# Patient Record
Sex: Female | Born: 1985 | Race: Asian | Hispanic: No | Marital: Married | Smoking: Never smoker
Health system: Southern US, Community
[De-identification: ages and names within clinical notes are randomized; demographics above are authoritative.]

## PROBLEM LIST (undated history)

## (undated) DIAGNOSIS — Z789 Other specified health status: Secondary | ICD-10-CM

## (undated) DIAGNOSIS — R51 Headache: Secondary | ICD-10-CM

## (undated) DIAGNOSIS — R519 Headache, unspecified: Secondary | ICD-10-CM

## (undated) HISTORY — DX: Headache, unspecified: R51.9

## (undated) HISTORY — DX: Headache: R51

## (undated) HISTORY — PX: NO PAST SURGERIES: SHX2092

---

## 2011-05-18 LAB — HM PAP SMEAR: HM Pap smear: NORMAL

## 2011-10-24 ENCOUNTER — Ambulatory Visit (INDEPENDENT_AMBULATORY_CARE_PROVIDER_SITE_OTHER): Payer: Managed Care, Other (non HMO) | Admitting: Internal Medicine

## 2011-10-24 ENCOUNTER — Encounter: Payer: Self-pay | Admitting: Internal Medicine

## 2011-10-24 VITALS — BP 90/60 | HR 76 | Temp 98.2°F | Resp 16 | Ht 64.0 in | Wt 115.0 lb

## 2011-10-24 DIAGNOSIS — Z0000 Encounter for general adult medical examination without abnormal findings: Secondary | ICD-10-CM

## 2011-10-24 LAB — COMPREHENSIVE METABOLIC PANEL
ALT: 13 U/L (ref 0–35)
AST: 19 U/L (ref 0–37)
Albumin: 4.3 g/dL (ref 3.5–5.2)
Alkaline Phosphatase: 49 U/L (ref 39–117)
BUN: 11 mg/dL (ref 6–23)
CO2: 27 mEq/L (ref 19–32)
Calcium: 9.2 mg/dL (ref 8.4–10.5)
Chloride: 104 mEq/L (ref 96–112)
Creatinine, Ser: 0.6 mg/dL (ref 0.4–1.2)
GFR: 124.09 mL/min (ref >60.00)
Glucose, Bld: 78 mg/dL (ref 70–99)
Potassium: 4 mEq/L (ref 3.5–5.1)
Sodium: 138 mEq/L (ref 135–145)
Total Bilirubin: 0.4 mg/dL (ref 0.3–1.2)
Total Protein: 7.4 g/dL (ref 6.0–8.3)

## 2011-10-24 LAB — LIPID PANEL
Cholesterol: 151 mg/dL (ref 0–200)
LDL Cholesterol: 82 mg/dL (ref 0–99)

## 2011-10-24 MED ORDER — TRETINOIN 0.05 % EX CREA: TOPICAL_CREAM | Freq: Every day | CUTANEOUS | Status: DC

## 2011-10-24 NOTE — Progress Notes (Signed)
Quick Note:  Spoke with pt- informed of lab results - mailed to house ______

## 2011-10-24 NOTE — Progress Notes (Signed)
  Subjective:    Patient ID: Jamie Jordan, female    DOB: 1986/01/30, 26 y.o.   MRN: [id number]  HPI  26 year old patient who is seen today to establish with our practice. She was seen in November and had a normal gynecologic evaluation with normal Pap. She enjoys excellent health. Concerns are a long history of left eye discomfort. This occurs approximately once per month and usually last 2-3 hours the pain resolves after  a nap.  Also seems to respond to Tylenol  She has a history of mild acne controlled with topical medications.    Review of Systems  Constitutional: Negative.   HENT: Negative for hearing loss, congestion, sore throat, rhinorrhea, dental problem, sinus pressure and tinnitus.   Eyes: Negative for pain, discharge and visual disturbance.  Respiratory: Negative for cough and shortness of breath.   Cardiovascular: Negative for chest pain, palpitations and leg swelling.  Gastrointestinal: Negative for nausea, vomiting, abdominal pain, diarrhea, constipation, blood in stool and abdominal distention.  Genitourinary: Negative for dysuria, urgency, frequency, hematuria, flank pain, vaginal bleeding, vaginal discharge, difficulty urinating, vaginal pain and pelvic pain.  Musculoskeletal: Negative for joint swelling, arthralgias and gait problem.  Skin: Positive for rash (mild acne eruptions).  Neurological: Positive for headaches. Negative for dizziness, syncope, speech difficulty, weakness and numbness.  Hematological: Negative for adenopathy.  Psychiatric/Behavioral: Negative for behavioral problems, dysphoric mood and agitation. The patient is not nervous/anxious.        Objective:   Physical Exam  Constitutional: She is oriented to person, place, and time. She appears well-developed and well-nourished.  HENT:  Head: Normocephalic.  Right Ear: External ear normal.  Left Ear: External ear normal.  Mouth/Throat: Oropharynx is clear and moist.  Eyes: Conjunctivae and EOM  are normal. Pupils are equal, round, and reactive to light.  Neck: Normal range of motion. Neck supple. No thyromegaly present.  Cardiovascular: Normal rate, regular rhythm, normal heart sounds and intact distal pulses.   Pulmonary/Chest: Effort normal and breath sounds normal.  Abdominal: Soft. Bowel sounds are normal. She exhibits no mass. There is no tenderness.  Musculoskeletal: Normal range of motion.  Lymphadenopathy:    She has no cervical adenopathy.  Neurological: She is alert and oriented to person, place, and time.  Skin: Skin is warm and dry. No rash noted.       Very mild resolving acneiform lesions involving the malar aspect of the face  Psychiatric: She has a normal mood and affect. Her behavior is normal.          Assessment & Plan:   Preventive health examination Mild acne Left periorbital headaches. Possible migraine syndrome. We'll give a trial of Excedrin Migraine over-the-counter  Return at her convenience for a PPD skin test

## 2011-10-24 NOTE — Patient Instructions (Addendum)
Excedrin Migraine-  1 every 4-6 hours for left  eye discomfort  Return at your convenience for a PPD skin test  Call or return to clinic prn if these symptoms worsen or fail to improve as anticipated.Acne Acne is a skin problem that causes pimples. Acne occurs when the pores in your skin get blocked. Your pores may become red, sore, and swollen (inflamed), or infected with a common skin bacterium (Propionibacterium acnes). Acne is a common skin problem. Up to 80% of people get acne at some time. Acne is especially common from the ages of 40 to 68. Acne usually goes away over time with proper treatment. CAUSES   Your pores each contain an oil gland. The oil glands make an oily substance called sebum. Acne happens when these glands get plugged with sebum, dead skin cells, and dirt. The P. acnes bacteria that are normally found in the oil glands then multiply, causing inflammation. Acne is commonly triggered by changes in your hormones. These hormonal changes can cause the oil glands to get bigger and to make more sebum. Factors that can make acne worse include:  Hormone changes during adolescence.   Hormone changes during women's menstrual cycles.   Hormone changes during pregnancy.   Oil-based cosmetics and hair products.   Harshly scrubbing the skin.   Strong soaps.   Stress.   Hormone problems due to certain diseases.   Long or oily hair rubbing against the skin.   Certain medicines.   Pressure from headbands, backpacks, or shoulder pads.   Exposure to certain oils and chemicals.  SYMPTOMS   Acne often occurs on the face, neck, chest, and upper back. Symptoms include:  Small, red bumps (pimples or papules).   Whiteheads (closed comedones).   Blackheads (open comedones).   Small, pus-filled pimples (pustules).   Big, red pimples or pustules that feel tender.  More severe acne can cause:  An infected area that contains a collection of pus (abscess).   Hard, painful,  fluid-filled sacs (cysts).   Scars.  DIAGNOSIS   Your caregiver can usually tell what the problem is by doing a physical exam. TREATMENT   There are many good treatments for acne. Some are available over-the-counter and some are available with a prescription. The treatment that is best for you depends on the type of acne you have and how severe it is. It may take 2 months of treatment before your acne gets better. Common treatments include:  Creams and lotions that prevent oil glands from clogging.   Creams and lotions that treat or prevent infections and inflammation.   Antibiotics applied to the skin or taken as a pill.   Pills that decrease sebum production.   Birth control pills.   Light or laser treatments.   Minor surgery.   Injections of medicine into the affected areas.   Chemicals that cause peeling of the skin.  HOME CARE INSTRUCTIONS   Good skin care is the most important part of treatment.  Wash your skin gently at least twice a day and after exercise. Always wash your skin before bed.   Use mild soap.   After each wash, apply a water-based skin moisturizer.   Keep your hair clean and off of your face. Shampoo your hair daily.   Only take medicines as directed by your caregiver.   Use a sunscreen or sunblock with SPF 30 or greater. This is especially important when you are using acne medicines.   Choose cosmetics that are noncomedogenic.  This means they do not plug the oil glands.   Avoid leaning your chin or forehead on your hands.   Avoid wearing tight headbands or hats.   Avoid picking or squeezing your pimples. This can make your acne worse and cause scarring.  SEEK MEDICAL CARE IF:    Your acne is not better after 8 weeks.   Your acne gets worse.   You have a large area of skin that is red or tender.  Document Released: 06/30/2000 Document Revised: 06/22/2011 Document Reviewed: 04/21/2011 [facility] Patient Information 2012 [place],  [place].Tretinoin skin cream (Acne) What is this medicine? TRETINOIN (TRET i noe in) is a naturally occurring form of vitamin A. It is used on the skin to treat mild to moderate acne. This medicine may be used for other purposes; ask your health care provider or pharmacist if you have questions. What should I tell my health care provider before I take this medicine? They need to know if you have any of these conditions: -eczema -excessive sensitivity to the sun -sunburn -an unusual or allergic reaction to tretinoin, vitamin A, other medicines, foods, dyes, or preservatives -pregnant or trying to get pregnant -breast-feeding How should I use this medicine? This medicine is for external use only. Do not take by mouth. Follow the directions on the prescription label. Gently wash your face with a mild, non-medicated soap before use. Pat the skin dry. Wait 20 to 30 minutes for your skin to dry before use in order to minimize the possibility of skin irritation. Apply enough medicine to cover the affected area and rub in gently. Avoid applying this medicine to your eyes, ears, nostrils, angles of the nose, and mouth. Do not use more often than your doctor or health care professional has recommended. Using too much of this medicine may irritate or increase the irritation of your skin, and will not give faster or better results. Talk to your pediatrician regarding the use of this medicine in children. While this drug may be prescribed for children as young as 56 years of age for selected conditions, precautions do apply. Overdosage: If you think you have taken too much of this medicine contact a poison control center or emergency room at once. NOTE: This medicine is only for you. Do not share this medicine with others. What if I miss a dose? If you miss a dose, skip that dose and continue with your regular schedule. Do not use extra doses, or use for a longer period of time than directed by your doctor or  health care professional. What may interact with this medicine? -medicines or other preparations that may dry your skin such as benzoyl peroxide or salicylic acid -medicines that increase your sensitivity to sunlight such as tetracycline or sulfa drugs This list may not describe all possible interactions. Give your health care provider a list of all the medicines, herbs, non-prescription drugs, or dietary supplements you use. Also tell them if you smoke, drink alcohol, or use illegal drugs. Some items may interact with your medicine. What should I watch for while using this medicine? Your acne may get worse initially and should then start to improve. It may take 2 to 12 weeks before you see the full effect. Do not wash your face more than 2 or 3 times a day, unless directed by your doctor or health care professional. Do not use the following products on the same areas that you are treating with this medicine, unless otherwise directed by your doctor or  health care professional: other topical agents with a strong skin drying effect such as products with a high alcohol content, astringents, spices, the peel of lime or other citrus, medicated soaps or shampoos, permanent wave solutions, electrolysis, hair removers or waxes, or any other preparations or processes that might dry or irritate your skin. This medicine can make you more sensitive to the sun. Keep out of the sun. If you cannot avoid being in the sun, wear protective clothing and use sunscreen. Do not use sun lamps or tanning beds/booths. Avoid cold weather and wind as much as possible, and use clothing to protect you from the weather. Skin treated with this medicine may dry out or get wind burned more easily. What side effects may I notice from receiving this medicine? Side effects that you should report to your doctor or health care professional as soon as possible: -darkening or lightening of the treated areas -severe burning, itching, crusting,  or swelling of the treated areas Side effects that usually do not require medical attention (report to your doctor or health care professional if they continue or are bothersome): -increased sensitivity to the sun -itching -mild stinging -red, inflamed, and irritated skin, the skin may peel after a few days This list may not describe all possible side effects. Call your doctor for medical advice about side effects. You may report side effects to FDA at 1-800-FDA-1088. Where should I keep my medicine? Keep out of the reach of children. Store below 27 degrees C (80 degrees F). Do not freeze. Protect from light. Throw away any unused medicine after the expiration date. NOTE: This sheet is a summary. It may not cover all possible information. If you have questions about this medicine, talk to your doctor, pharmacist, or health care provider.  2012, [employer]vier/[employer] Standard. (03/18/2008 5:38:22 PM)

## 2012-05-17 ENCOUNTER — Telehealth: Payer: Self-pay | Admitting: Internal Medicine

## 2012-05-17 NOTE — Telephone Encounter (Signed)
Ok with me. Should get new patient visit at some point to establish. Thanks.

## 2012-05-17 NOTE — Telephone Encounter (Signed)
ok 

## 2012-05-17 NOTE — Telephone Encounter (Signed)
Pt requesting to switch to female physician, would like to see Dr. Selena Batten.

## 2012-05-23 ENCOUNTER — Encounter: Payer: Managed Care, Other (non HMO) | Admitting: Family Medicine

## 2012-05-23 NOTE — Progress Notes (Signed)
No show  This encounter was created in error - please disregard.

## 2012-05-29 ENCOUNTER — Ambulatory Visit: Payer: Managed Care, Other (non HMO) | Admitting: Family Medicine

## 2012-06-04 ENCOUNTER — Encounter: Payer: Self-pay | Admitting: Family Medicine

## 2012-06-04 ENCOUNTER — Other Ambulatory Visit (HOSPITAL_COMMUNITY)
Admission: RE | Admit: 2012-06-04 | Discharge: 2012-06-04 | Disposition: A | Discharge status: Home / Self Care | Payer: Managed Care, Other (non HMO) | Source: Ambulatory Visit | Attending: Family Medicine | Admitting: Family Medicine

## 2012-06-04 ENCOUNTER — Ambulatory Visit (INDEPENDENT_AMBULATORY_CARE_PROVIDER_SITE_OTHER): Payer: Managed Care, Other (non HMO) | Admitting: Family Medicine

## 2012-06-04 VITALS — BP 92/62 | HR 87 | Temp 98.8°F | Ht 63.75 in | Wt 118.0 lb

## 2012-06-04 DIAGNOSIS — Z209 Contact with and (suspected) exposure to unspecified communicable disease: Secondary | ICD-10-CM

## 2012-06-04 DIAGNOSIS — L292 Pruritus vulvae: Secondary | ICD-10-CM | POA: Insufficient documentation

## 2012-06-04 DIAGNOSIS — N760 Acute vaginitis: Secondary | ICD-10-CM | POA: Insufficient documentation

## 2012-06-04 DIAGNOSIS — L293 Anogenital pruritus, unspecified: Secondary | ICD-10-CM

## 2012-06-04 DIAGNOSIS — Z8669 Personal history of other diseases of the nervous system and sense organs: Secondary | ICD-10-CM | POA: Insufficient documentation

## 2012-06-04 MED ORDER — FLUCONAZOLE 150 MG PO TABS: 150.0000 mg | ORAL_TABLET | Freq: Once | ORAL | Status: DC

## 2012-06-04 MED ORDER — SUMATRIPTAN SUCCINATE 50 MG PO TABS: ORAL_TABLET | ORAL | Status: DC

## 2012-06-04 NOTE — Addendum Note (Signed)
Addended by: Azucena Freed on: 06/04/2012 03:09 PM   Modules accepted: Orders

## 2012-06-04 NOTE — Patient Instructions (Addendum)
-  We have ordered labs or studies at this visit. It can take up to 1-2 weeks for results and processing. We will contact you with instructions IF your results are abnormal. Normal results will be released to your [facility]. If you have not heard from [place] or can not find your results in [facility] in 2 weeks please contact our office.  -PLEASE SIGN UP FOR MYCHART TODAY   We recommend the following healthy lifestyle measures: - eat a healthy diet consisting of lots of vegetables, fruits, beans, nuts, seeds, healthy meats such as white chicken and fish and whole grains.  - avoid fried foods, fast food, processed foods, sodas, red meet and other fattening foods.  - get a least 150 minutes of aerobic exercise per week.   Only use water of dove soap to wash. Can use vasoline for dry skin.  Follow up in: 2-3 months

## 2012-06-04 NOTE — Progress Notes (Addendum)
Chief Complaint  Patient presents with  . Establish Care    HPI: Jamie Jordan is here to establish care. Wanted a female doctor and transferring from Dr. Kirtland Bouchard. Last physical in April. Last pap in November - normal. Never had abnormal pap. Only sexually active with her husband. No new partners. Masters in [employer] and hearing. Moved from [place]. Exercise and Diet is ok.  Has the following concerns today:  Vulvovaginal pruritis: -on and off -reports tested by gyn and just dryness -no abnormal vaginal discharge  Headaches: -for a long time -always L side around eye -occurs a few times per year and relieved with lying in dark room and taking a nap -denies change in headaches, vomiting, nausea, aura  R Breast Pain: -in the past on and off for about 1 year -no symptoms now, no lumps -doesn't know if related to periods  Occ ulcers in mouth: -had a few times in the past -none now  Vaccines: refused, UTD on other vaccines  ROS: See pertinent positives and negatives per HPI.  History reviewed. No pertinent past medical history.  Family History  Problem Relation Age of Onset  . Diabetes Maternal Grandmother     History   Social History  . Marital Status: Married    Spouse Name: N/A    Number of Children: N/A  . Years of Education: N/A   Social History Main Topics  . Smoking status: Never Smoker   . Smokeless tobacco: Never Used  . Alcohol Use: Yes     Comment: occ  . Drug Use: No  . Sexually Active: Yes   Other Topics Concern  . None   Social History Narrative  . None    Current outpatient prescriptions:b complex vitamins tablet, Take 1 tablet by mouth daily., Disp: , Rfl: ;  fluconazole (DIFLUCAN) 150 MG tablet, Take 1 tablet (150 mg total) by mouth once., Disp: 1 tablet, Rfl: 0;  SUMAtriptan (IMITREX) 50 MG tablet, 50mg  at onset of headaches. May repeat in 2 hours. No more then 2 tablets in 24 hours, Disp: 10 tablet, Rfl: 0  EXAM:  Filed Vitals:   06/04/12  1412  BP: 92/62  Pulse: 87  Temp: 98.8 F (37.1 C)    Body mass index is 20.41 kg/(m^2).  GENERAL: vitals reviewed and listed above, alert, oriented, appears well hydrated and in no acute distress  HEENT: atraumatic, conjunttiva clear, no obvious abnormalities on inspection of external nose and ears  NECK: no obvious masses on inspection  LUNGS: clear to auscultation bilaterally, no wheezes, rales or rhonchi, good air movement  CV: HRRR, no peripheral edema  BREAST: normal  GU: mild vulvar irritation, no abnormal vaginal dischare - wet prep obtained  MS: moves all extremities without noticeable abnormality  PSYCH: pleasant and cooperative, no obvious depression or anxiety  ASSESSMENT AND PLAN:  Discussed the following assessment and plan:  1. Vulvovaginal itching  -likely yeast versus contact dermatitis, wet prep pending fluconazole (DIFLUCAN) 150 MG tablet Water only or hypoallergenic wash Hypoallergenic detergent Follow up if not resolved in 2-3 weeks  2. Headaches, likely migraine -discussed tx options and she would like to try triptan -follow up in 2-3 months  -We reviewed the PMH, PSH, FH, SH, Meds and Allergies. -We provided refills for any medications we will prescribe as needed. -We addressed current concerns per orders and patient instructions. -We have asked for records for pertinent exams, studies, vaccines and notes from previous providers. -We have advised patient to follow up per  instructions below. -Refused flu -TB test today for work  -Patient advised to return or notify a doctor immediately if symptoms worsen or persist or new concerns arise.  Patient Instructions  -We have ordered labs or studies at this visit. It can take up to 1-2 weeks for results and processing. We will contact you with instructions IF your results are abnormal. Normal results will be released to your [facility]. If you have not heard from [place] or can not find your results in  [facility] in 2 weeks please contact our office.  -PLEASE SIGN UP FOR MYCHART TODAY   We recommend the following healthy lifestyle measures: - eat a healthy diet consisting of lots of vegetables, fruits, beans, nuts, seeds, healthy meats such as white chicken and fish and whole grains.  - avoid fried foods, fast food, processed foods, sodas, red meet and other fattening foods.  - get a least 150 minutes of aerobic exercise per week.   Only use water of dove soap to wash. Can use vasoline for dry skin.  Follow up in: 2-3 months      Jamie Buffalo R.

## 2012-06-06 ENCOUNTER — Other Ambulatory Visit: Payer: Self-pay | Admitting: Family Medicine

## 2012-06-06 ENCOUNTER — Other Ambulatory Visit: Payer: Managed Care, Other (non HMO)

## 2012-06-06 DIAGNOSIS — Z209 Contact with and (suspected) exposure to unspecified communicable disease: Secondary | ICD-10-CM

## 2012-06-06 DIAGNOSIS — Z139 Encounter for screening, unspecified: Secondary | ICD-10-CM

## 2012-06-07 ENCOUNTER — Telehealth: Payer: Self-pay | Admitting: Family Medicine

## 2012-06-07 LAB — HEPATITIS B SURFACE ANTIBODY,QUALITATIVE: Hep B S Ab: POSITIVE — AB

## 2012-06-07 LAB — HEPATITIS B CORE ANTIBODY, TOTAL: Hep B Core Total Ab: NEGATIVE

## 2012-06-07 NOTE — Telephone Encounter (Signed)
Hep B titers ready for her to pick up if she needs them.

## 2012-06-10 NOTE — Telephone Encounter (Signed)
Called and spoke with pt and pt is aware. Labs mailed to pt's home address.  Address verified.

## 2012-06-11 ENCOUNTER — Telehealth: Payer: Self-pay | Admitting: Family Medicine

## 2012-06-11 NOTE — Telephone Encounter (Signed)
Please let her know, she did have a yeast infection. Hopefully symptoms have resolved with the treatment.

## 2012-06-11 NOTE — Telephone Encounter (Signed)
Called and spoke with pt and pt is aware.  Pt states the itching has gone away.

## 2013-09-04 ENCOUNTER — Ambulatory Visit (INDEPENDENT_AMBULATORY_CARE_PROVIDER_SITE_OTHER): Payer: Managed Care, Other (non HMO) | Admitting: Family Medicine

## 2013-09-04 ENCOUNTER — Other Ambulatory Visit (HOSPITAL_COMMUNITY)
Admission: RE | Admit: 2013-09-04 | Discharge: 2013-09-04 | Disposition: A | Discharge status: Home / Self Care | Payer: Managed Care, Other (non HMO) | Source: Ambulatory Visit | Attending: Family Medicine | Admitting: Family Medicine

## 2013-09-04 ENCOUNTER — Encounter: Payer: Self-pay | Admitting: Family Medicine

## 2013-09-04 VITALS — BP 120/70 | Temp 99.0°F | Ht 63.75 in | Wt 126.0 lb

## 2013-09-04 DIAGNOSIS — N898 Other specified noninflammatory disorders of vagina: Secondary | ICD-10-CM

## 2013-09-04 DIAGNOSIS — Z01419 Encounter for gynecological examination (general) (routine) without abnormal findings: Secondary | ICD-10-CM | POA: Insufficient documentation

## 2013-09-04 DIAGNOSIS — N760 Acute vaginitis: Secondary | ICD-10-CM | POA: Insufficient documentation

## 2013-09-04 DIAGNOSIS — Z0000 Encounter for general adult medical examination without abnormal findings: Secondary | ICD-10-CM

## 2013-09-04 DIAGNOSIS — Z113 Encounter for screening for infections with a predominantly sexual mode of transmission: Secondary | ICD-10-CM | POA: Insufficient documentation

## 2013-09-04 MED ORDER — FLUCONAZOLE 150 MG PO TABS: 150.0000 mg | ORAL_TABLET | Freq: Once | ORAL | Status: DC

## 2013-09-04 NOTE — Progress Notes (Signed)
Chief Complaint  Patient presents with  . Annual Exam    HPI:  Here for CPE:  -Concerns today:  Vulvovaginal pruritis/discharge: -white curdy discharge and itching -not worried about STIs -denies: fevers, pelvic or abd pain, dysuria, NV  -Diet: variety of foods, balance and well rounded - diet is not great though  -Taking folic acid, calcium or vit d:   -Exercise: no regular exercise - some on and off  -Diabetes and Dyslipidemia Screening: done in last few years and normal  -Hx of HTN: no  -Vaccines: UTD  -pap history: 05/2011 and normal  -FDLMP: last week of january  -sexual activity: yes with husabnd, female partner, no new partners  -wants STI testing: no other then gc chlam for discharge  -FH breast, colon or ovarian ca: see FH  -Alcohol, Tobacco, drug use: see social history  Review of Systems - neg except above  No past medical history on file.  No past surgical history on file.  Family History  Problem Relation Age of Onset  . Diabetes Maternal Grandmother     History   Social History  . Marital Status: Married    Spouse Name: N/A    Number of Children: N/A  . Years of Education: N/A   Social History Main Topics  . Smoking status: Never Smoker   . Smokeless tobacco: Never Used  . Alcohol Use: Yes     Comment: occ  . Drug Use: No  . Sexual Activity: Yes   Other Topics Concern  . None   Social History Narrative  . None    Current outpatient prescriptions:b complex vitamins tablet, Take 1 tablet by mouth daily., Disp: , Rfl: ;  fluconazole (DIFLUCAN) 150 MG tablet, Take 1 tablet (150 mg total) by mouth once. May take another in 1 week if still having symptoms., Disp: 2 tablet, Rfl: 0;  SUMAtriptan (IMITREX) 50 MG tablet, 50mg  at onset of headaches. May repeat in 2 hours. No more then 2 tablets in 24 hours, Disp: 10 tablet, Rfl: 0  EXAM:  Filed Vitals:   09/04/13 1422  BP: 120/70  Temp: 99 F (37.2 C)    GENERAL: vitals reviewed  and listed below, alert, oriented, appears well hydrated and in no acute distress  HEENT: head atraumatic, PERRLA, normal appearance of eyes, ears, nose and mouth. moist mucus membranes.  NECK: supple, no masses or lymphadenopathy  LUNGS: clear to auscultation bilaterally, no rales, rhonchi or wheeze  CV: HRRR, no peripheral edema or cyanosis, normal pedal pulses  BREAST: normal appearance - no lesions or discharge, on palpation normal breast tissue without any suspicious masses  ABDOMEN: bowel sounds normal, soft, non tender to palpation, no masses, no rebound or guarding  GU: normal appearance of external genitalia - no lesions or masses, normal vaginal mucosa - white cheesy discharge, normal appearance of cervix - no lesions or abnormal discharge, no masses or tenderness on palpation of uterus and ovaries.  RECTAL: refused  SKIN: no rash or abnormal lesions  MS: normal gait, moves all extremities normally  NEURO: CN II-XII grossly intact, normal muscle strength and sensation to light touch on extremities  PSYCH: normal affect, pleasant and cooperative  ASSESSMENT AND PLAN:  Discussed the following assessment and plan:  Routine general medical examination at a health care facility - Plan: Cytology - PAP  Vaginal discharge - Plan: fluconazole (DIFLUCAN) 150 MG tablet   -Discussed and advised all [place] preventive services health task force level A and B recommendations for  age, sex and risks.  -pruritis and discharge likely yeast - discussed options and she wants to tx empirically with diflucan, advised no douching  -wet prep, pap, GC chlamydia pending, doe snot want other STI testing   -Advised at least 150 minutes of exercise per week and a healthy diet low in saturated fats and sweets and consisting of fresh fruits and vegetables, lean meats such as fish and white chicken and whole grains.  -labs, studies and vaccines per orders this encounter  No orders of the defined  types were placed in this encounter.    Patient Instructions  -start folic acid, vit d and make sure getting calcium in diet  -We have ordered labs or studies at this visit. It can take up to 1-2 weeks for results and processing. We will contact you with instructions IF your results are abnormal. Normal results will be released to your [facility]. If you have not heard from us or can not find your results in [facility] in 2 weeks please contact our office.  -PLEASE SIGN UP FOR MYCHART TODAY   We recommend the following healthy lifestyle measures: - eat a healthy diet consisting of lots of vegetables, fruits, beans, nuts, seeds, healthy meats such as white chicken and fish and whole grains.  - avoid fried foods, fast food, processed foods, sodas, red meet and other fattening foods.  - get a least 150 minutes of aerobic exercise per week.   Follow up in: 1 year or as needed     Patient advised to return to clinic immediately if symptoms worsen or persist or new concerns.  @LIFEPLAN @  Return in about 1 year (around 09/04/2014), or if symptoms worsen or fail to improve.  Jamie BasqueKIM, Jamie Heinzel R.

## 2013-09-04 NOTE — Progress Notes (Signed)
Pre visit review using our clinic review tool, if applicable. No additional management support is needed unless otherwise documented below in the visit note. 

## 2013-09-04 NOTE — Patient Instructions (Signed)
-  start folic acid, vit d and make sure getting calcium in diet  -We have ordered labs or studies at this visit. It can take up to 1-2 weeks for results and processing. We will contact you with instructions IF your results are abnormal. Normal results will be released to your [facility]. If you have not heard from us or can not find your results in [facility] in 2 weeks please contact our office.  -PLEASE SIGN UP FOR MYCHART TODAY   We recommend the following healthy lifestyle measures: - eat a healthy diet consisting of lots of vegetables, fruits, beans, nuts, seeds, healthy meats such as white chicken and fish and whole grains.  - avoid fried foods, fast food, processed foods, sodas, red meet and other fattening foods.  - get a least 150 minutes of aerobic exercise per week.   Follow up in: 1 year or as needed

## 2013-09-08 LAB — CERVICOVAGINAL ANCILLARY ONLY
BACTERIAL VAGINITIS: POSITIVE — AB
Candida vaginitis: NEGATIVE

## 2013-09-08 NOTE — Progress Notes (Signed)
Left a message for return call.  

## 2013-09-10 ENCOUNTER — Telehealth: Payer: Self-pay | Admitting: Internal Medicine

## 2013-09-10 MED ORDER — METRONIDAZOLE 500 MG PO TABS: 500.0000 mg | ORAL_TABLET | Freq: Two times a day (BID) | ORAL | Status: DC

## 2013-09-10 NOTE — Telephone Encounter (Signed)
Pt states she was returning your call 

## 2013-09-10 NOTE — Telephone Encounter (Signed)
Left a message for return call; See result note.  

## 2013-09-10 NOTE — Addendum Note (Signed)
Addended by: Azucena FreedMILLNER, Allah Reason C on: 09/10/2013 03:00 PM   Modules accepted: Orders

## 2014-09-07 ENCOUNTER — Encounter: Payer: Managed Care, Other (non HMO) | Admitting: Family Medicine

## 2014-10-13 ENCOUNTER — Ambulatory Visit (INDEPENDENT_AMBULATORY_CARE_PROVIDER_SITE_OTHER): Payer: Managed Care, Other (non HMO) | Admitting: Family Medicine

## 2014-10-13 ENCOUNTER — Encounter: Payer: Self-pay | Admitting: Family Medicine

## 2014-10-13 VITALS — BP 90/68 | HR 65 | Temp 97.6°F | Ht 63.75 in | Wt 131.9 lb

## 2014-10-13 DIAGNOSIS — Z0000 Encounter for general adult medical examination without abnormal findings: Secondary | ICD-10-CM

## 2014-10-13 LAB — LIPID PANEL
Cholesterol: 146 mg/dL (ref 0–200)
HDL: 57.6 mg/dL (ref >39.00)
LDL Cholesterol: 78 mg/dL (ref 0–99)
NonHDL: 88.4
Total CHOL/HDL Ratio: 3
Triglycerides: 51 mg/dL (ref 0.0–149.0)
VLDL: 10.2 mg/dL (ref 0.0–40.0)

## 2014-10-13 LAB — HEMOGLOBIN A1C: HEMOGLOBIN A1C: 5.3 % (ref 4.6–6.5)

## 2014-10-13 NOTE — Progress Notes (Signed)
HPI:  Here for CPE:  -Concerns and/or follow up today: none  -Diet: variety of foods, balance and well rounded  -Exercise: regular exercise - recently started exercises  -Taking folic acid, vitamin D or calcium: no  -Diabetes and Dyslipidemia Screening:   -Hx of HTN: no  -Vaccines: UTD  -pap history: pap 08/2013 and normal and all pap smears were normal  -FDLMP: 09/16/2014  -sexual activity: yes, female partner, no new partners  -wants STI testing: no  -FH breast, colon or ovarian ca: see FH Last mammogram: n/a Last colon cancer screening: n/a  Breast Ca Risk Assessment: -[email or web address]  -Alcohol, Tobacco, drug use: see social history  Review of Systems - no fevers, unintentional weight loss, vision loss, hearing loss, chest pain, sob, hemoptysis, melena, hematochezia, hematuria, genital discharge, changing or concerning skin lesions, bleeding, bruising, loc, thoughts of self harm or SI  No past medical history on file.  No past surgical history on file.  Family History  Problem Relation Age of Onset  . Diabetes Maternal Grandmother     History   Social History  . Marital Status: Married    Spouse Name: N/A  . Number of Children: N/A  . Years of Education: N/A   Social History Main Topics  . Smoking status: Never Smoker   . Smokeless tobacco: Never Used  . Alcohol Use: Yes     Comment: occ  . Drug Use: No  . Sexual Activity: Yes   Other Topics Concern  . 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  EXAM:  Filed Vitals:   10/13/14 0908  BP: 90/68  Pulse: 65  Temp: 97.6 F (36.4 C)    GENERAL: vitals reviewed and listed below, alert, oriented, appears well hydrated and in no acute  distress  HEENT: head atraumatic, PERRLA, normal appearance of eyes, ears, nose and mouth. moist mucus membranes.  NECK: supple, no masses or lymphadenopathy  LUNGS: clear to auscultation bilaterally, no rales, rhonchi or wheeze  CV: HRRR, no peripheral edema or cyanosis, normal pedal pulses  BREAST: deferred  ABDOMEN: bowel sounds normal, soft, non tender to palpation, no masses, no rebound or guarding  GU: declined  RECTAL: refused  SKIN: no rash or abnormal lesions  MS: normal gait, moves all extremities normally  NEURO: CN II-XII grossly intact, normal muscle strength and sensation to light touch on extremities  PSYCH: normal affect, pleasant and cooperative  ASSESSMENT AND PLAN:  Discussed the following assessment and plan:  Visit for preventive health examination - Plan: Lipid Panel, Hemoglobin A1c   -Discussed and advised all [place] preventive services health task force level A and B recommendations for age, sex and risks.  -Advised at least 150 minutes of exercise per week and a healthy diet low in saturated fats and sweets and consisting of fresh fruits and vegetables, lean meats such as fish and white chicken and whole grains.  -FASTING labs, studies and vaccines per orders this encounter  Orders Placed This Encounter  Procedures  . Lipid Panel  . Hemoglobin A1c    Patient advised to return to clinic immediately if symptoms worsen or persist or new concerns.  Patient Instructions  BEFORE  YOU LEAVE: -labs  -We have ordered labs or studies at this visit. It can take up to 1-2 weeks for results and processing. We will contact you with instructions IF your results are abnormal. Normal results will be released to your [facility]. If you have not heard from us or can not find your results in [facility] in 2 weeks please contact our office.  Folic acid 400mcg daily  Vitamin D3 [phone] IU daily    No Follow-up on file.  Kriste BasqueKIM, Shenelle Klas R.

## 2014-10-13 NOTE — Patient Instructions (Addendum)
BEFORE YOU LEAVE: -labs  -We have ordered labs or studies at this visit. It can take up to 1-2 weeks for results and processing. We will contact you with instructions IF your results are abnormal. Normal results will be released to your [facility]. If you have not heard from us or can not find your results in [facility] in 2 weeks please contact our office.  Folic acid 400mcg daily  Vitamin D3 [phone] IU daily

## 2014-10-13 NOTE — Progress Notes (Signed)
Pre visit review using our clinic review tool, if applicable. No additional management support is needed unless otherwise documented below in the visit note. 

## 2015-02-23 ENCOUNTER — Telehealth: Payer: Self-pay | Admitting: Family Medicine

## 2015-02-23 NOTE — Telephone Encounter (Signed)
Pt call to say that she a question about a [facility]  Procedure and is asking if Dr Selena Batten will call her .

## 2015-02-23 NOTE — Telephone Encounter (Signed)
Dr Verne Spurr call pt--I spoke with the pt and she stated she is 9-[redacted] weeks pregnant and has been seen by Dr Huel Cote (OB/GYN), had an ultrasound yesterday and there was no heartbeat and was told she may need a D&C but wanted to talk to Dr Selena Batten first to see if this is safe or should she take the tablet which makes you bleed out to lose the fetus?  States she is to get the lab results today which will let her know truly how far along she was/is.

## 2015-02-23 NOTE — Telephone Encounter (Signed)
Left a message for the pt to return my call.  

## 2015-02-23 NOTE — Telephone Encounter (Signed)
[phone] (home)  Spoke with pt.

## 2015-02-25 ENCOUNTER — Encounter (HOSPITAL_COMMUNITY): Payer: Self-pay | Admitting: *Deleted

## 2015-02-28 NOTE — Anesthesia Preprocedure Evaluation (Addendum)
Anesthesia Evaluation  Patient identified by MRN, date of birth, ID band Patient awake    Reviewed: Allergy & Precautions, NPO status , Patient's Chart, lab work & pertinent test results  Airway Mallampati: II       Dental  (+) Dental Advisory Given   Pulmonary neg pulmonary ROS,  breath sounds clear to auscultation        Cardiovascular negative cardio ROS  Rhythm:Regular     Neuro/Psych negative neurological ROS  negative psych ROS   GI/Hepatic negative GI ROS, Neg liver ROS,   Endo/Other  negative endocrine ROS  Renal/GU negative Renal ROS     Musculoskeletal   Abdominal (+)  Abdomen: soft.    Peds  Hematology 13/39   Anesthesia Other Findings   Reproductive/Obstetrics Missed AB 12 weeks                            Anesthesia Physical Anesthesia Plan  ASA: II  Anesthesia Plan: MAC   Post-op Pain Management:    Induction: Intravenous  Airway Management Planned: Nasal Cannula and Mask  Additional Equipment:   Intra-op Plan:   Post-operative Plan:   Informed Consent: I have reviewed the patients History and Physical, chart, labs and discussed the procedure including the risks, benefits and alternatives for the proposed anesthesia with the patient or authorized representative who has indicated his/her understanding and acceptance.     Plan Discussed with:   Anesthesia Plan Comments: (Check am labs, verify 1st trimester)       Anesthesia Quick Evaluation

## 2015-03-02 NOTE — H&P (Signed)
Jamie Jordan is an 29 y.o. female G2P0010 who presented for her confirmatory [place] and found to have a [redacted]w[redacted]d demise. [name]  Pt reports normal menses q month.  She has not had any bleeding or cramping. She is healthy otherwise and reports only one prior pregnancy that was a very early termination with oral meds.  Her blood type is O positive.  rtinent Gynecological History: OB History: EAB x 1  Menstrual History:  No LMP recorded.    Past Medical History  Diagnosis Date  . Medical history non-contributory     Past Surgical History  Procedure Laterality Date  . No past surgeries      Family History  Problem Relation Age of Onset  . Diabetes Maternal Grandmother     Social History:  reports that she has never smoked. She has never used smokeless tobacco. She reports that she drinks alcohol. She reports that she does not use illicit drugs.  Allergies: No Known Allergies  No prescriptions prior to admission    Review of Systems  Gastrointestinal: Negative for abdominal pain.    There were no vitals taken for this visit. Physical Exam  Constitutional: She appears well-developed and well-nourished.  Cardiovascular: Normal rate and regular rhythm.   Respiratory: Effort normal.  GI: Soft.  Genitourinary: Vagina normal.  Neurological: She is alert.  Psychiatric: She has a normal mood and affect.    No results found for this or any previous visit (from the past 24 hour(s)).  No results found.  Assessment/Plan: The findings of missed abortion were discussed with the patient in detail. We discussed her options going forward with expectant management, cytotec induced bleeding, and D&C. The risks and benefits of each approach were reviewed and the patient has elected a D&C. The risks of bleeding, infection and possible uterine perforation were d/w the pt and she  desires to proceed.    Oliver Pila 03/02/2015, 10:58 PM

## 2015-03-03 ENCOUNTER — Encounter (HOSPITAL_COMMUNITY)
Admission: RE | Disposition: A | Discharge status: Home / Self Care | Payer: Self-pay | Source: Ambulatory Visit | Attending: Obstetrics and Gynecology

## 2015-03-03 ENCOUNTER — Ambulatory Visit (HOSPITAL_COMMUNITY)
Admission: RE | Admit: 2015-03-03 | Discharge: 2015-03-03 | Disposition: A | Discharge status: Home / Self Care | Payer: Managed Care, Other (non HMO) | Source: Ambulatory Visit | Attending: Obstetrics and Gynecology | Admitting: Obstetrics and Gynecology

## 2015-03-03 ENCOUNTER — Ambulatory Visit (HOSPITAL_COMMUNITY): Payer: Managed Care, Other (non HMO) | Admitting: Anesthesiology

## 2015-03-03 ENCOUNTER — Encounter (HOSPITAL_COMMUNITY): Payer: Self-pay

## 2015-03-03 DIAGNOSIS — O021 Missed abortion: Secondary | ICD-10-CM | POA: Insufficient documentation

## 2015-03-03 HISTORY — PX: DILATION AND EVACUATION: SHX1459

## 2015-03-03 HISTORY — DX: Other specified health status: Z78.9

## 2015-03-03 LAB — CBC
HEMATOCRIT: 39.3 % (ref 36.0–46.0)
Hemoglobin: 13.7 g/dL (ref 12.0–15.0)
MCH: 30.9 pg (ref 26.0–34.0)
MCHC: 34.9 g/dL (ref 30.0–36.0)
MCV: 88.7 fL (ref 78.0–100.0)
Platelets: 276 10*3/uL (ref 150–400)
RBC: 4.43 MIL/uL (ref 3.87–5.11)
RDW: 12.9 % (ref 11.5–15.5)
WBC: 10.6 10*3/uL — ABNORMAL HIGH (ref 4.0–10.5)

## 2015-03-03 SURGERY — DILATION AND EVACUATION, UTERUS
Anesthesia: Monitor Anesthesia Care | Site: Vagina

## 2015-03-03 MED ORDER — ONDANSETRON HCL 4 MG/2ML IJ SOLN
INTRAMUSCULAR | Status: AC
  Filled 2015-03-03: qty 2

## 2015-03-03 MED ORDER — LIDOCAINE HCL (CARDIAC) 20 MG/ML IV SOLN
INTRAVENOUS | Status: DC | PRN
  Administered 2015-03-03: 50 mg via INTRAVENOUS

## 2015-03-03 MED ORDER — LIDOCAINE HCL 1 % IJ SOLN
INTRAMUSCULAR | Status: DC | PRN
Start: 2015-03-03 — End: 2015-03-03
  Administered 2015-03-03: 20 mL

## 2015-03-03 MED ORDER — SCOPOLAMINE 1 MG/3DAYS TD PT72
1.0000 | MEDICATED_PATCH | Freq: Once | TRANSDERMAL | Status: DC
  Administered 2015-03-03: 1.5 mg via TRANSDERMAL

## 2015-03-03 MED ORDER — DEXAMETHASONE SODIUM PHOSPHATE 10 MG/ML IJ SOLN
INTRAMUSCULAR | Status: DC | PRN
  Administered 2015-03-03: 4 mg via INTRAVENOUS

## 2015-03-03 MED ORDER — MIDAZOLAM HCL 2 MG/2ML IJ SOLN
INTRAMUSCULAR | Status: DC | PRN
  Administered 2015-03-03: 2 mg via INTRAVENOUS

## 2015-03-03 MED ORDER — PROPOFOL 500 MG/50ML IV EMUL
INTRAVENOUS | Status: DC | PRN
  Administered 2015-03-03: 50 mg via INTRAVENOUS
  Administered 2015-03-03 (×2): 20 mg via INTRAVENOUS
  Administered 2015-03-03: 30 mg via INTRAVENOUS

## 2015-03-03 MED ORDER — SCOPOLAMINE 1 MG/3DAYS TD PT72
MEDICATED_PATCH | TRANSDERMAL | Status: AC
  Administered 2015-03-03: 1.5 mg via TRANSDERMAL
  Filled 2015-03-03: qty 1

## 2015-03-03 MED ORDER — PROPOFOL 10 MG/ML IV BOLUS
INTRAVENOUS | Status: AC
  Filled 2015-03-03: qty 40

## 2015-03-03 MED ORDER — LACTATED RINGERS IV SOLN
INTRAVENOUS | Status: DC
  Administered 2015-03-03 (×2): via INTRAVENOUS

## 2015-03-03 MED ORDER — KETOROLAC TROMETHAMINE 30 MG/ML IJ SOLN
INTRAMUSCULAR | Status: AC
  Filled 2015-03-03: qty 1

## 2015-03-03 MED ORDER — LIDOCAINE HCL (PF) 1 % IJ SOLN
INTRAMUSCULAR | Status: AC
  Filled 2015-03-03: qty 5

## 2015-03-03 MED ORDER — FENTANYL CITRATE (PF) 100 MCG/2ML IJ SOLN
INTRAMUSCULAR | Status: AC
  Filled 2015-03-03: qty 4

## 2015-03-03 MED ORDER — FENTANYL CITRATE (PF) 100 MCG/2ML IJ SOLN
INTRAMUSCULAR | Status: DC | PRN
  Administered 2015-03-03 (×2): 50 ug via INTRAVENOUS

## 2015-03-03 MED ORDER — MIDAZOLAM HCL 2 MG/2ML IJ SOLN
INTRAMUSCULAR | Status: AC
  Filled 2015-03-03: qty 4

## 2015-03-03 MED ORDER — LIDOCAINE HCL 1 % IJ SOLN
INTRAMUSCULAR | Status: AC
  Filled 2015-03-03: qty 20

## 2015-03-03 MED ORDER — ONDANSETRON HCL 4 MG/2ML IJ SOLN
INTRAMUSCULAR | Status: DC | PRN
  Administered 2015-03-03: 4 mg via INTRAVENOUS

## 2015-03-03 MED ORDER — KETOROLAC TROMETHAMINE 30 MG/ML IJ SOLN
INTRAMUSCULAR | Status: DC | PRN
  Administered 2015-03-03: 30 mg via INTRAVENOUS

## 2015-03-03 MED ORDER — LACTATED RINGERS IV SOLN
INTRAVENOUS | Status: DC
  Administered 2015-03-03: 12:00:00 via INTRAVENOUS

## 2015-03-03 SURGICAL SUPPLY — 17 items
CATH ROBINSON RED A/P 16FR (CATHETERS) ×3 IMPLANT
CLOTH BEACON ORANGE TIMEOUT ST (SAFETY) ×3 IMPLANT
DECANTER SPIKE VIAL GLASS SM (MISCELLANEOUS) ×3 IMPLANT
GLOVE BIO SURGEON STRL SZ 6.5 (GLOVE) ×2 IMPLANT
GLOVE BIO SURGEONS STRL SZ 6.5 (GLOVE) ×1
GOWN STRL REUS W/TWL LRG LVL3 (GOWN DISPOSABLE) ×6 IMPLANT
KIT BERKELEY 1ST TRIMESTER 3/8 (MISCELLANEOUS) ×3 IMPLANT
NS IRRIG 1000ML POUR BTL (IV SOLUTION) ×3 IMPLANT
PACK VAGINAL MINOR WOMEN LF (CUSTOM PROCEDURE TRAY) ×3 IMPLANT
PAD OB MATERNITY 4.3X12.25 (PERSONAL CARE ITEMS) ×3 IMPLANT
PAD PREP 24X48 CUFFED NSTRL (MISCELLANEOUS) ×3 IMPLANT
SET BERKELEY SUCTION TUBING (SUCTIONS) ×3 IMPLANT
TOWEL OR 17X24 6PK STRL BLUE (TOWEL DISPOSABLE) ×6 IMPLANT
VACURETTE 10 RIGID CVD (CANNULA) IMPLANT
VACURETTE 7MM CVD STRL WRAP (CANNULA) ×3 IMPLANT
VACURETTE 8 RIGID CVD (CANNULA) IMPLANT
VACURETTE 9 RIGID CVD (CANNULA) IMPLANT

## 2015-03-03 NOTE — Anesthesia Postprocedure Evaluation (Signed)
  Anesthesia Post-op Note  Patient: Jamie Jordan  Procedure(s) Performed: Procedure(s): DILATATION AND EVACUATION (N/A)  Patient Location: PACU  Anesthesia Type:MAC  Level of Consciousness: awake and alert   Airway and Oxygen Therapy: Patient Spontanous Breathing  Post-op Pain: mild  Post-op Assessment: Post-op Vital signs reviewed, Patient's Cardiovascular Status Stable, Respiratory Function Stable, Patent Airway and No signs of Nausea or vomiting              Post-op Vital Signs: Reviewed and stable  Last Vitals:  Filed Vitals:   03/03/15 1107  BP: 112/81  Pulse: 82  Temp: 36.9 C  Resp: 16    Complications: No apparent anesthesia complications

## 2015-03-03 NOTE — Op Note (Signed)
Operative Note    Preoperative Diagnosis Missed abortion at 8+ weeks  Postoperative Diagnosis same  Procedure Suction dilation and evacuation  Surgeon Huel Cote  Anesthesia MAC  Fluids: EBL 50cc UOP 50cc IVF800cc  Findings Uterus sharply anteverted and measured 7-8 weeks size.  POC's obtained on first pass  Specimen POC's  Procedure Note Patient was taken to the operating room where LMA anesthesia was obtained without difficulty. She was then prepped and draped in the normal sterile fashion in the dorsal lithotomy position. An appropriate time out was performed. A speculum was then placed within the vagina and the anterior lip of the cervix identified and injected with approximately 2 cc of 1% plain lidocaine. An additional 9 cc each was placed at 2 and 10:00 for a paracervical block. Uterus was then sounded to 7-8 cm.  The Pratt dilators utilized to dilate the cervix up to approximately 24. The seven mm suction currette was introduced into the fundus and moderate POC's obtained on the first pass    Two additional passes did not render further tissue.  A gentle sharp currettage was performed and no tissue felt.  One additional pass with suction did not get any further tissue.  The uterus was normal in size at the conclusion of the procedure. All instruments were removed from the vagina.  The tenaculum site was hemostatic with silver nitrate.  Finally the speculum was removed from the vagina and the patient awakened and taken to the recovery room in good condition.

## 2015-03-03 NOTE — Progress Notes (Signed)
Patient ID: Jamie Jordan, female   DOB: [birth year], 29 y.o.   MRN: [id number] Per pt no changes in dictated H&P, brief exam WNL  Ready to proceed.

## 2015-03-03 NOTE — Transfer of Care (Signed)
Immediate Anesthesia Transfer of Care Note  Patient: Jamie Jordan  Procedure(s) Performed: Procedure(s): DILATATION AND EVACUATION (N/A)  Patient Location: PACU  Anesthesia Type:MAC  Level of Consciousness: awake, alert  and oriented  Airway & Oxygen Therapy: Patient Spontanous Breathing and Patient connected to nasal cannula oxygen  Post-op Assessment: Report given to RN and Post -op Vital signs reviewed and stable  Post vital signs: Reviewed and stable  Last Vitals:  Filed Vitals:   03/03/15 1107  BP: 112/81  Pulse: 82  Temp: 36.9 C  Resp: 16    Complications: No apparent anesthesia complications

## 2015-03-05 ENCOUNTER — Encounter (HOSPITAL_COMMUNITY): Payer: Self-pay | Admitting: Obstetrics and Gynecology

## 2015-03-05 NOTE — Addendum Note (Signed)
Addendum  created 03/05/15 1102 by Randa Spike, CRNA   Modules edited: Charges VN

## 2015-07-18 NOTE — L&D Delivery Note (Signed)
Delivery Note Pt reached complete dilation and pushed very well. At 2:05 AM a healthy female was delivered via Vaginal, Spontaneous Delivery (Presentation: OA  ).  APGAR: 7, 9; weight 7 lb 7.9 oz (3400 g).   Placenta status: delivered spontaneously .  Cord:  with the following complications:none .   Anesthesia:  Epidural x 2 Episiotomy: None Lacerations: 2nd degree (irregular laceration, sphincter reinforced/ Suture Repair: 2.0 vicryl and 3-0 vicryl rapide Est. Blood Loss (mL): 225ml  Mom to postpartum.  Baby to Couplet care / Skin to Skin. D/w pt circumcision and she declines  Coren Sagan W 03/14/2016, 3:22 AM

## 2015-08-05 LAB — OB RESULTS CONSOLE ABO/RH: RH Type: POSITIVE

## 2015-08-05 LAB — OB RESULTS CONSOLE ANTIBODY SCREEN: Antibody Screen: NEGATIVE

## 2015-08-05 LAB — OB RESULTS CONSOLE RUBELLA ANTIBODY, IGM: Rubella: NON-IMMUNE/NOT IMMUNE

## 2015-08-05 LAB — OB RESULTS CONSOLE RPR: RPR: NONREACTIVE

## 2015-08-05 LAB — OB RESULTS CONSOLE HIV ANTIBODY (ROUTINE TESTING): HIV: NONREACTIVE

## 2015-08-05 LAB — OB RESULTS CONSOLE GC/CHLAMYDIA
CHLAMYDIA, DNA PROBE: NEGATIVE
Gonorrhea: NEGATIVE

## 2015-08-05 LAB — OB RESULTS CONSOLE HEPATITIS B SURFACE ANTIGEN: HEP B S AG: NEGATIVE

## 2016-02-04 LAB — OB RESULTS CONSOLE GBS: GBS: NEGATIVE

## 2016-03-07 ENCOUNTER — Encounter (HOSPITAL_COMMUNITY): Payer: Self-pay | Admitting: *Deleted

## 2016-03-07 ENCOUNTER — Telehealth (HOSPITAL_COMMUNITY): Payer: Self-pay | Admitting: *Deleted

## 2016-03-13 ENCOUNTER — Encounter (HOSPITAL_COMMUNITY): Payer: Self-pay | Admitting: *Deleted

## 2016-03-13 ENCOUNTER — Inpatient Hospital Stay (HOSPITAL_COMMUNITY)
Admission: AD | Admit: 2016-03-13 | Discharge: 2016-03-15 | DRG: 775 | Disposition: A | Discharge status: Home / Self Care | Payer: Managed Care, Other (non HMO) | Source: Ambulatory Visit | Attending: Obstetrics and Gynecology | Admitting: Obstetrics and Gynecology

## 2016-03-13 ENCOUNTER — Inpatient Hospital Stay (HOSPITAL_COMMUNITY): Payer: Managed Care, Other (non HMO) | Admitting: Anesthesiology

## 2016-03-13 DIAGNOSIS — Z3A40 40 weeks gestation of pregnancy: Secondary | ICD-10-CM

## 2016-03-13 DIAGNOSIS — Z3490 Encounter for supervision of normal pregnancy, unspecified, unspecified trimester: Secondary | ICD-10-CM

## 2016-03-13 DIAGNOSIS — Z833 Family history of diabetes mellitus: Secondary | ICD-10-CM | POA: Diagnosis not present

## 2016-03-13 DIAGNOSIS — Z3483 Encounter for supervision of other normal pregnancy, third trimester: Secondary | ICD-10-CM | POA: Diagnosis present

## 2016-03-13 LAB — CBC
HCT: 39.9 % (ref 36.0–46.0)
HEMOGLOBIN: 13.8 g/dL (ref 12.0–15.0)
MCH: 30.9 pg (ref 26.0–34.0)
MCHC: 34.6 g/dL (ref 30.0–36.0)
MCV: 89.5 fL (ref 78.0–100.0)
Platelets: 251 10*3/uL (ref 150–400)
RBC: 4.46 MIL/uL (ref 3.87–5.11)
RDW: 13.1 % (ref 11.5–15.5)
WBC: 12.3 10*3/uL — AB (ref 4.0–10.5)

## 2016-03-13 LAB — ABO/RH: ABO/RH(D): O POS

## 2016-03-13 LAB — TYPE AND SCREEN
ABO/RH(D): O POS
ANTIBODY SCREEN: NEGATIVE

## 2016-03-13 MED ORDER — LACTATED RINGERS IV SOLN
INTRAVENOUS | Status: DC
  Administered 2016-03-13 – 2016-03-14 (×2): via INTRAVENOUS

## 2016-03-13 MED ORDER — PHENYLEPHRINE 40 MCG/ML (10ML) SYRINGE FOR IV PUSH (FOR BLOOD PRESSURE SUPPORT): 80.0000 ug | PREFILLED_SYRINGE | INTRAVENOUS | Status: DC | PRN

## 2016-03-13 MED ORDER — EPHEDRINE 5 MG/ML INJ
10.0000 mg | INTRAVENOUS | Status: DC | PRN
  Filled 2016-03-13: qty 4

## 2016-03-13 MED ORDER — SOD CITRATE-CITRIC ACID 500-334 MG/5ML PO SOLN: 30.0000 mL | ORAL | Status: DC | PRN

## 2016-03-13 MED ORDER — OXYTOCIN BOLUS FROM INFUSION
500.0000 mL | Freq: Once | INTRAVENOUS | Status: AC
  Administered 2016-03-14: 500 mL via INTRAVENOUS

## 2016-03-13 MED ORDER — OXYTOCIN 40 UNITS IN LACTATED RINGERS INFUSION - SIMPLE MED
2.5000 [IU]/h | INTRAVENOUS | Status: DC
  Filled 2016-03-13: qty 1000

## 2016-03-13 MED ORDER — EPHEDRINE 5 MG/ML INJ: 10.0000 mg | INTRAVENOUS | Status: DC | PRN

## 2016-03-13 MED ORDER — LIDOCAINE HCL (PF) 1 % IJ SOLN
INTRAMUSCULAR | Status: DC | PRN
  Administered 2016-03-13 (×4): 4 mL

## 2016-03-13 MED ORDER — PHENYLEPHRINE 40 MCG/ML (10ML) SYRINGE FOR IV PUSH (FOR BLOOD PRESSURE SUPPORT)
80.0000 ug | PREFILLED_SYRINGE | INTRAVENOUS | Status: DC | PRN
  Filled 2016-03-13: qty 5
  Filled 2016-03-13 (×2): qty 10

## 2016-03-13 MED ORDER — FENTANYL 2.5 MCG/ML BUPIVACAINE 1/10 % EPIDURAL INFUSION (WH - ANES)
14.0000 mL/h | INTRAMUSCULAR | Status: DC | PRN
Start: 2016-03-13 — End: 2016-03-14
  Administered 2016-03-13 – 2016-03-14 (×3): 14 mL/h via EPIDURAL
  Filled 2016-03-13 (×2): qty 125

## 2016-03-13 MED ORDER — PHENYLEPHRINE 40 MCG/ML (10ML) SYRINGE FOR IV PUSH (FOR BLOOD PRESSURE SUPPORT)
80.0000 ug | PREFILLED_SYRINGE | INTRAVENOUS | Status: DC | PRN
Start: 2016-03-13 — End: 2016-03-13

## 2016-03-13 MED ORDER — LACTATED RINGERS IV SOLN
500.0000 mL | INTRAVENOUS | Status: DC | PRN
  Administered 2016-03-13: 500 mL via INTRAVENOUS

## 2016-03-13 MED ORDER — OXYCODONE-ACETAMINOPHEN 5-325 MG PO TABS: 1.0000 | ORAL_TABLET | ORAL | Status: DC | PRN

## 2016-03-13 MED ORDER — DIPHENHYDRAMINE HCL 50 MG/ML IJ SOLN: 12.5000 mg | INTRAMUSCULAR | Status: DC | PRN

## 2016-03-13 MED ORDER — OXYTOCIN 40 UNITS IN LACTATED RINGERS INFUSION - SIMPLE MED
1.0000 m[IU]/min | INTRAVENOUS | Status: DC
  Administered 2016-03-13: 2 m[IU]/min via INTRAVENOUS

## 2016-03-13 MED ORDER — LIDOCAINE HCL (PF) 1 % IJ SOLN
30.0000 mL | INTRAMUSCULAR | Status: DC | PRN
  Filled 2016-03-13: qty 30

## 2016-03-13 MED ORDER — ONDANSETRON HCL 4 MG/2ML IJ SOLN: 4.0000 mg | Freq: Four times a day (QID) | INTRAMUSCULAR | Status: DC | PRN

## 2016-03-13 MED ORDER — SODIUM BICARBONATE 8.4 % IV SOLN
INTRAVENOUS | Status: DC | PRN
  Administered 2016-03-13: 3 mL via EPIDURAL
  Administered 2016-03-13: 4 mL via EPIDURAL

## 2016-03-13 MED ORDER — LACTATED RINGERS IV SOLN: 500.0000 mL | Freq: Once | INTRAVENOUS | Status: DC

## 2016-03-13 MED ORDER — FENTANYL 2.5 MCG/ML BUPIVACAINE 1/10 % EPIDURAL INFUSION (WH - ANES): 14.0000 mL/h | INTRAMUSCULAR | Status: DC | PRN

## 2016-03-13 MED ORDER — ACETAMINOPHEN 325 MG PO TABS: 650.0000 mg | ORAL_TABLET | ORAL | Status: DC | PRN

## 2016-03-13 MED ORDER — LACTATED RINGERS IV SOLN
500.0000 mL | Freq: Once | INTRAVENOUS | Status: AC
  Administered 2016-03-13: 500 mL via INTRAVENOUS

## 2016-03-13 MED ORDER — OXYCODONE-ACETAMINOPHEN 5-325 MG PO TABS: 2.0000 | ORAL_TABLET | ORAL | Status: DC | PRN

## 2016-03-13 MED ORDER — PHENYLEPHRINE 40 MCG/ML (10ML) SYRINGE FOR IV PUSH (FOR BLOOD PRESSURE SUPPORT)
80.0000 ug | PREFILLED_SYRINGE | INTRAVENOUS | Status: DC | PRN
  Filled 2016-03-13: qty 5

## 2016-03-13 NOTE — Progress Notes (Signed)
Patient ID: Jamie BilberryShrithi Klomp, female   DOB: [birth year], 30 y.o.   MRN: [id number] Pt uncomfortable on left side despite re-dosing and positioning to that side.  She has declined to have epidural replaced  afeb vss Cervix 90/8+/0  Contractions not adequate, will start pitocin FHR category 1 overall with mild early decels

## 2016-03-13 NOTE — H&P (Signed)
Jamie Jordan is a 30 y.o. G3P0020 at 30 5/7 weeks (EDD 03/08/16 by LMP c/w 9 week US)  female presenting for painful contractions every 6-7 minutes and cervical change to 4-5 cm.  Prenatal care uncomplicated except rubella non-immune  OB History    Gravida Para Term Preterm AB Living   3       2     SAB TAB Ectopic Multiple Live Births   1 1          SAB x 1 EAB x 1  Past Medical History:  Diagnosis Date  . Headache   . Medical history non-contributory    Past Surgical History:  Procedure Laterality Date  . DILATION AND EVACUATION N/A 03/03/2015   Procedure: DILATATION AND EVACUATION;  Surgeon: Huel CoteKathy Aziyah Provencal, MD;  Location: [facility];  Service: Gynecology;  Laterality: N/A;  . NO PAST SURGERIES     Family History: family history includes Diabetes in her maternal grandmother. Social History:  reports that she has never smoked. She has never used smokeless tobacco. She reports that she drinks alcohol. She reports that she does not use drugs.     Maternal Diabetes: No Genetic Screening: Normal Maternal Ultrasounds/Referrals: Normal Fetal Ultrasounds or other Referrals:  None Maternal Substance Abuse:  No Significant Maternal Medications:  None Significant Maternal Lab Results:  None Other Comments:  None  Review of Systems  Gastrointestinal: Positive for abdominal pain.   Maternal Medical History:  Reason for admission: Contractions.   Contractions: Onset was 6-12 hours ago.   Frequency: regular.   Perceived severity is moderate.    Fetal activity: Perceived fetal activity is normal.    Prenatal Complications - Diabetes: none.      Blood pressure 119/89, pulse 81, temperature 98 F (36.7 C), temperature source Oral, resp. rate 18, height 5\' 3"  (1.6 m), weight 69.4 kg (153 lb), last menstrual period 06/02/2015. Maternal Exam:  Uterine Assessment: Contraction strength is moderate.  Contraction frequency is regular.   Abdomen: Patient reports no abdominal  tenderness. Fetal presentation: vertex  Introitus: Normal vulva. Normal vagina.    Physical Exam  Constitutional: She is oriented to person, place, and time. She appears well-developed.  Cardiovascular: Normal rate.   Respiratory: Effort normal.  GI: Soft.  Genitourinary: Vagina normal and uterus normal.  Neurological: She is alert and oriented to person, place, and time.  Psychiatric: She has a normal mood and affect.    Prenatal labs: ABO, Rh: --/--/O POS (08/28 1725) Antibody: PENDING (08/28 1725) Rubella: Nonimmune (01/19 0000) RPR: Nonreactive (01/19 0000)  HBsAg: Negative (01/19 0000)  HIV: Non-reactive (01/19 0000)  GBS: Negative (07/21 0000)  Hgb AA CF negative First trimester screen negative One hour GCT 122  Assessment/Plan: Pt admitted in latent labor.  Plan AROM, pitocin prn augmentation if progress slows.    Oliver PilaICHARDSON,Kahlee Metivier W 03/13/2016, 6:20 PM

## 2016-03-13 NOTE — Anesthesia Procedure Notes (Signed)
Epidural Patient location during procedure: OB  Staffing Anesthesiologist: Yarden Hillis Performed: anesthesiologist   Preanesthetic Checklist Completed: patient identified, site marked, surgical consent, pre-op evaluation, timeout performed, IV checked, risks and benefits discussed and monitors and equipment checked  Epidural Patient position: sitting Prep: site prepped and draped and DuraPrep Patient monitoring: continuous pulse ox and blood pressure Approach: midline Location: L3-L4 Injection technique: LOR saline  Needle:  Needle type: Tuohy  Needle gauge: 17 G Needle length: 9 cm and 9 Needle insertion depth: 5 cm cm Catheter type: closed end flexible Catheter size: 19 Gauge Catheter at skin depth: 10 cm Test dose: negative  Assessment Events: blood not aspirated, injection not painful, no injection resistance, negative IV test and no paresthesia  Additional Notes Patient identified. Risks/Benefits/Options discussed with patient including but not limited to bleeding, infection, nerve damage, paralysis, failed block, incomplete pain control, headache, blood pressure changes, nausea, vomiting, reactions to medication both or allergic, itching and postpartum back pain. Confirmed with bedside nurse the patient's most recent platelet count. Confirmed with patient that they are not currently taking any anticoagulation, have any bleeding history or any family history of bleeding disorders. Patient expressed understanding and wished to proceed. All questions were answered. Sterile technique was used throughout the entire procedure. Please see nursing notes for vital signs. Test dose was given through epidural catheter and negative prior to continuing to dose epidural or start infusion. Warning signs of high block given to the patient including shortness of breath, tingling/numbness in hands, complete motor block, or any concerning symptoms with instructions to call for help. Patient was  given instructions on fall risk and not to get out of bed. All questions and concerns addressed with instructions to call with any issues or inadequate analgesia.        

## 2016-03-13 NOTE — Anesthesia Procedure Notes (Signed)
Epidural Patient location during procedure: OB  Staffing Anesthesiologist: Cybill Uriegas Performed: anesthesiologist   Preanesthetic Checklist Completed: patient identified, site marked, surgical consent, pre-op evaluation, timeout performed, IV checked, risks and benefits discussed and monitors and equipment checked  Epidural Patient position: sitting Prep: site prepped and draped and DuraPrep Patient monitoring: continuous pulse ox and blood pressure Approach: midline Location: L3-L4 Injection technique: LOR saline  Needle:  Needle type: Tuohy  Needle gauge: 17 G Needle length: 9 cm and 9 Needle insertion depth: 5 cm cm Catheter type: closed end flexible Catheter size: 19 Gauge Catheter at skin depth: 10 cm Test dose: negative  Assessment Events: blood not aspirated, injection not painful, no injection resistance, negative IV test and no paresthesia  Additional Notes Patient identified. Risks/Benefits/Options discussed with patient including but not limited to bleeding, infection, nerve damage, paralysis, failed block, incomplete pain control, headache, blood pressure changes, nausea, vomiting, reactions to medication both or allergic, itching and postpartum back pain. Confirmed with bedside nurse the patient's most recent platelet count. Confirmed with patient that they are not currently taking any anticoagulation, have any bleeding history or any family history of bleeding disorders. Patient expressed understanding and wished to proceed. All questions were answered. Sterile technique was used throughout the entire procedure. Please see nursing notes for vital signs. Test dose was given through epidural catheter and negative prior to continuing to dose epidural or start infusion. Warning signs of high block given to the patient including shortness of breath, tingling/numbness in hands, complete motor block, or any concerning symptoms with instructions to call for help. Patient was  given instructions on fall risk and not to get out of bed. All questions and concerns addressed with instructions to call with any issues or inadequate analgesia.        

## 2016-03-13 NOTE — Anesthesia Preprocedure Evaluation (Signed)
Anesthesia Evaluation  Patient identified by MRN, date of birth, ID band Patient awake    Reviewed: Allergy & Precautions, NPO status , Patient's Chart, lab work & pertinent test results  History of Anesthesia Complications Negative for: history of anesthetic complications  Airway Mallampati: II  TM Distance: >3 FB Neck ROM: Full    Dental no notable dental hx. (+) Dental Advisory Given   Pulmonary neg pulmonary ROS,    Pulmonary exam normal breath sounds clear to auscultation       Cardiovascular negative cardio ROS Normal cardiovascular exam Rhythm:Regular Rate:Normal     Neuro/Psych negative neurological ROS  negative psych ROS   GI/Hepatic negative GI ROS, Neg liver ROS,   Endo/Other  negative endocrine ROS  Renal/GU negative Renal ROS  negative genitourinary   Musculoskeletal negative musculoskeletal ROS (+)   Abdominal   Peds negative pediatric ROS (+)  Hematology negative hematology ROS (+)   Anesthesia Other Findings   Reproductive/Obstetrics (+) Pregnancy                             Anesthesia Physical Anesthesia Plan  ASA: II  Anesthesia Plan: Epidural   Post-op Pain Management:    Induction:   Airway Management Planned:   Additional Equipment:   Intra-op Plan:   Post-operative Plan:   Informed Consent: I have reviewed the patients History and Physical, chart, labs and discussed the procedure including the risks, benefits and alternatives for the proposed anesthesia with the patient or authorized representative who has indicated his/her understanding and acceptance.   Dental advisory given  Plan Discussed with: CRNA  Anesthesia Plan Comments:         Anesthesia Quick Evaluation  

## 2016-03-14 ENCOUNTER — Encounter (HOSPITAL_COMMUNITY): Payer: Self-pay | Admitting: General Practice

## 2016-03-14 LAB — CBC
HEMATOCRIT: 37.7 % (ref 36.0–46.0)
Hemoglobin: 13 g/dL (ref 12.0–15.0)
MCH: 30.8 pg (ref 26.0–34.0)
MCHC: 34.5 g/dL (ref 30.0–36.0)
MCV: 89.3 fL (ref 78.0–100.0)
PLATELETS: 217 10*3/uL (ref 150–400)
RBC: 4.22 MIL/uL (ref 3.87–5.11)
RDW: 13 % (ref 11.5–15.5)
WBC: 17.2 10*3/uL — ABNORMAL HIGH (ref 4.0–10.5)

## 2016-03-14 LAB — RPR: RPR Ser Ql: NONREACTIVE

## 2016-03-14 MED ORDER — ONDANSETRON HCL 4 MG PO TABS: 4.0000 mg | ORAL_TABLET | ORAL | Status: DC | PRN

## 2016-03-14 MED ORDER — DIBUCAINE 1 % RE OINT: 1.0000 "application " | TOPICAL_OINTMENT | RECTAL | Status: DC | PRN

## 2016-03-14 MED ORDER — IBUPROFEN 600 MG PO TABS
600.0000 mg | ORAL_TABLET | Freq: Four times a day (QID) | ORAL | Status: DC
  Administered 2016-03-14 – 2016-03-15 (×6): 600 mg via ORAL
  Filled 2016-03-14 (×6): qty 1

## 2016-03-14 MED ORDER — COCONUT OIL OIL
1.0000 | TOPICAL_OIL | Status: DC | PRN
Start: 2016-03-14 — End: 2016-03-15

## 2016-03-14 MED ORDER — WITCH HAZEL-GLYCERIN EX PADS: 1.0000 "application " | MEDICATED_PAD | CUTANEOUS | Status: DC | PRN

## 2016-03-14 MED ORDER — PRENATAL MULTIVITAMIN CH
1.0000 | ORAL_TABLET | Freq: Every day | ORAL | Status: DC
  Administered 2016-03-14 – 2016-03-15 (×2): 1 via ORAL
  Filled 2016-03-14 (×2): qty 1

## 2016-03-14 MED ORDER — DIPHENHYDRAMINE HCL 25 MG PO CAPS: 25.0000 mg | ORAL_CAPSULE | Freq: Four times a day (QID) | ORAL | Status: DC | PRN

## 2016-03-14 MED ORDER — SIMETHICONE 80 MG PO CHEW: 80.0000 mg | CHEWABLE_TABLET | ORAL | Status: DC | PRN

## 2016-03-14 MED ORDER — OXYCODONE HCL 5 MG PO TABS: 10.0000 mg | ORAL_TABLET | ORAL | Status: DC | PRN

## 2016-03-14 MED ORDER — BENZOCAINE-MENTHOL 20-0.5 % EX AERO
1.0000 "application " | INHALATION_SPRAY | CUTANEOUS | Status: DC | PRN
  Administered 2016-03-14: 1 via TOPICAL
  Filled 2016-03-14: qty 56

## 2016-03-14 MED ORDER — ACETAMINOPHEN 325 MG PO TABS: 650.0000 mg | ORAL_TABLET | ORAL | Status: DC | PRN

## 2016-03-14 MED ORDER — SENNOSIDES-DOCUSATE SODIUM 8.6-50 MG PO TABS
2.0000 | ORAL_TABLET | ORAL | Status: DC
  Administered 2016-03-14: 2 via ORAL
  Filled 2016-03-14: qty 2

## 2016-03-14 MED ORDER — TETANUS-DIPHTH-ACELL PERTUSSIS 5-2.5-18.5 LF-MCG/0.5 IM SUSP: 0.5000 mL | Freq: Once | INTRAMUSCULAR | Status: DC

## 2016-03-14 MED ORDER — OXYCODONE HCL 5 MG PO TABS
5.0000 mg | ORAL_TABLET | ORAL | Status: DC | PRN
  Administered 2016-03-14 – 2016-03-15 (×2): 5 mg via ORAL
  Filled 2016-03-14 (×2): qty 1

## 2016-03-14 MED ORDER — ONDANSETRON HCL 4 MG/2ML IJ SOLN: 4.0000 mg | INTRAMUSCULAR | Status: DC | PRN

## 2016-03-14 MED ORDER — ZOLPIDEM TARTRATE 5 MG PO TABS: 5.0000 mg | ORAL_TABLET | Freq: Every evening | ORAL | Status: DC | PRN

## 2016-03-14 NOTE — Anesthesia Postprocedure Evaluation (Signed)
Anesthesia Post Note  Patient: Jamie Jordan  Procedure(s) Performed: * No procedures listed *  Patient location during evaluation: Mother Baby Anesthesia Type: Epidural Level of consciousness: awake and alert and oriented Pain management: pain level controlled Vital Signs Assessment: post-procedure vital signs reviewed and stable Respiratory status: spontaneous breathing, nonlabored ventilation and respiratory function stable Cardiovascular status: stable Postop Assessment: no headache, no backache, patient able to bend at knees, adequate PO intake and no signs of nausea or vomiting Anesthetic complications: no Comments: Patient stated epidural was not functioning and the physician replaced it. Once this was done, the epidural worked well.      Last Vitals:  Vitals:   03/14/16 0410 03/14/16 0536  BP: 105/81 107/83  Pulse: 89 73  Resp: 18 18  Temp: 37 C 36.9 C    Last Pain:  Vitals:   03/14/16 0536  TempSrc: Axillary  PainSc:    Pain Goal:   3              Jadan Hinojos

## 2016-03-14 NOTE — Progress Notes (Signed)
Post Partum Day 0 Subjective: no complaints, up ad lib, voiding, tolerating PO and nl lochia, pain controlled  Objective: Blood pressure 107/83, pulse 73, temperature 98.5 F (36.9 C), temperature source Axillary, resp. rate 18, height 5\' 3"  (1.6 m), weight 69.4 kg (153 lb), last menstrual period 06/02/2015, SpO2 99 %, unknown if currently breastfeeding.  Physical Exam:  General: alert and no distress Lochia: appropriate Uterine Fundus: firm   Recent Labs  03/13/16 1725 03/14/16 0433  HGB 13.8 13.0  HCT 39.9 37.7    Assessment/Plan: Plan for discharge tomorrow or Wednesday, Breastfeeding and Lactation consult.  Routine care.     LOS: 1 day   Bovard-Stuckert, Carleta Woodrow 03/14/2016, 8:33 AM

## 2016-03-15 ENCOUNTER — Inpatient Hospital Stay (HOSPITAL_COMMUNITY): Admission: RE | Admit: 2016-03-15 | Payer: Managed Care, Other (non HMO) | Source: Ambulatory Visit

## 2016-03-15 MED ORDER — IBUPROFEN 600 MG PO TABS: 600.0000 mg | ORAL_TABLET | Freq: Four times a day (QID) | ORAL | 0 refills | Status: DC

## 2016-03-15 MED ORDER — OXYCODONE HCL 5 MG PO TABS: 5.0000 mg | ORAL_TABLET | ORAL | 0 refills | Status: DC | PRN

## 2016-03-15 NOTE — Lactation Note (Signed)
This note was copied from a baby's chart. Lactation Consultation Note  Patient Name: Boy Auburn BilberryShrithi Hudlow GNFAO'ZToday's Date: 03/15/2016   baby has been to the breast several times in life .  As LC's walked in mom attempting to latch.  LC assisted with orientee. 1st to change a stool diaper. ( mec)  And then placed baby skin to skin and worked on positioning and depth at  The breast . Used the cross cradle hold and depth achieved. Few swallows noted .  Baby released on his own and nipple appeared well rounded, easily hand expressed  Afterwards and encouraged mom to apply to nipples liberally.  Reviewed basic teaching with mom - hand expressing , positioning, STS feedings until baby can stay awake for  A feeding and gaining weight. Both mom and dad receptive to teaching.  Mother informed of post-discharge support and given phone number to the lactation department, including services for phone call assistance; out-patient appointments; and breastfeeding support group. List of other breastfeeding resources in the community  given in the handout. Encouraged mother to call for problems or concerns related to breastfeeding.   Maternal Data    Feeding Feeding Type: Breast Fed  [facility]                      Lactation Tools Discussed/Used     Consult Status      Kathrin Greathouseorio, Nadelyn Enriques Ann 03/15/2016, 8:36 AM

## 2016-03-15 NOTE — Progress Notes (Signed)
Post Partum Day 1 Subjective: no complaints and tolerating PO  Would like d/c home  Objective: Blood pressure 111/73, pulse 72, temperature 98.3 F (36.8 C), temperature source Oral, resp. rate 14, height 5\' 3"  (1.6 m), weight 69.4 kg (153 lb), last menstrual period 06/02/2015, SpO2 100 %, unknown if currently breastfeeding.  Physical Exam:  General: alert and cooperative Lochia: appropriate Uterine Fundus: firm    Recent Labs  03/13/16 1725 03/14/16 0433  HGB 13.8 13.0  HCT 39.9 37.7    Assessment/Plan: Discharge home  F/u office 6 weeks   LOS: 2 days   Jamie Jordan W 03/15/2016, 8:54 AM

## 2016-03-15 NOTE — Lactation Note (Addendum)
This note was copied from a baby's chart. Lactation Consultation Note  Patient Name: Jamie Jordan ZOXWR'UToday's Date: 03/15/2016 Reason for consult: Follow-up assessment;Other (Comment);Infant weight loss (3% weight loss. Bili at 23 hours 8.8 )  Baby is 31 hours old and has been consistent at the breast. Per mom the baby has been cluster feeding.  @ consult after feeding had large wet diaper, LC changed it.  Per mom had been given comfort gels for tender nipples. LC assessed nipples with moms permission and noted no breakdown  , just pink . LC reminded to hand express liberally and apply EBM to nipples. Also showed mom how to use breast shells except when sleeping,  Hand pump and #24 Flange a good fit. Feel the soreness is transient and explained to mom that is a sign the milk is coming in.  Per mom has a DEBP ( Medela 0 at home) . Sore nipple and engorgement prevention and tx reviewed.  LC stressed to mom it is important when milk comes in to always soften 1st breast well, before offering the 2nd breast so the baby gets to the fatty milk  And it will enhance the weight again. LC reassured mom and dad 3% weight loss is low and it is a good sign for how many stools baby has had .  Mother informed of post-discharge support and given phone number to the lactation department, including services for phone call assistance; out-patient  appointments; and breastfeeding support group. List of other breastfeeding resources in the community given in the handout. Encouraged mother to call for  problems or concerns related to breastfeeding.   Maternal Data    Feeding Feeding Type:  (recently breast fed ) Length of feed: 10 min (per  mom )  LATCH Score/Interventions Latch: Grasps breast easily, tongue down, lips flanged, rhythmical sucking.  Audible Swallowing: A few with stimulation  Type of Nipple: Everted at rest and after stimulation  Comfort (Breast/Nipple): Soft / non-tender     Hold  (Positioning): No assistance needed to correctly position infant at breast.  LATCH Score: 9  Lactation Tools Discussed/Used     Consult Status      Kathrin Greathouseorio, Zonya Gudger Ann 03/15/2016, 9:53 AM

## 2016-03-15 NOTE — Discharge Summary (Signed)
OB Discharge Summary     Patient Name: Jamie Jordan DOB: [birth year] MRN: [id number]  Date of admission: 03/13/2016 Delivering MD: Huel CoteICHARDSON, Abimelec Grochowski   Date of discharge: 03/15/2016  Admitting diagnosis: labor Intrauterine pregnancy: 152w6d     Secondary diagnosis:  Active Problems:   Pregnancy   NSVD (normal spontaneous vaginal delivery)  Additional problems: none     Discharge diagnosis: Term Pregnancy Delivered                                                                                                Post partum procedures:none  Augmentation: pitocin  Complications: None  Hospital course:  Onset of Labor With Vaginal Delivery     30 y.o. yo [id number] at 272w6d was admitted in Active Labor on 03/13/2016. Patient had an uncomplicated labor course as follows:  Membrane Rupture Time/Date: 6:33 PM ,03/13/2016   Intrapartum Procedures: Episiotomy: None [1]                                         Lacerations:  2nd degree [3]  Patient had a delivery of a Viable infant. 03/14/2016  Information for the patient's newborn:  Ila McgillBopanna, Boy Jazzie [[id number]][[id number]]  Delivery Method: Vaginal, Spontaneous Delivery (Filed from Delivery Summary)    Pateint had an uncomplicated postpartum course.  She is ambulating, tolerating a regular diet, passing flatus, and urinating well. Patient is discharged home in stable condition on 03/15/16.    Physical exam  Vitals:   03/14/16 0536 03/14/16 0930 03/14/16 1740 03/15/16 0548  BP: 107/83 97/64 111/69 111/73  Pulse: 73 94 83 72  Resp: 18 18 18 14   Temp: 98.5 F (36.9 C) 98 F (36.7 C) 99.1 F (37.3 C) 98.3 F (36.8 C)  TempSrc: Axillary Oral Oral Oral  SpO2: 99%  100%   Weight:      Height:       General: alert and cooperative Lochia: appropriate Uterine Fundus: firm  } Labs: Lab Results  Component Value Date   WBC 17.2 (H) 03/14/2016   HGB 13.0 03/14/2016   HCT 37.7 03/14/2016   MCV 89.3 03/14/2016   PLT 217 03/14/2016   CMP  Latest Ref Rng & Units 10/24/2011  Glucose 70 - 99 mg/dL 78  BUN 6 - 23 mg/dL 11  Creatinine 0.4 - 1.2 mg/dL 0.6  Sodium 696135 - 295145 mEq/L 138  Potassium 3.5 - 5.1 mEq/L 4.0  Chloride 96 - 112 mEq/L 104  CO2 19 - 32 mEq/L 27  Calcium 8.4 - 10.5 mg/dL 9.2  Total Protein 6.0 - 8.3 g/dL 7.4  Total Bilirubin 0.3 - 1.2 mg/dL 0.4  Alkaline Phos 39 - 117 U/L 49  AST 0 - 37 U/L 19  ALT 0 - 35 U/L 13    Discharge instruction: per After Visit Summary and "Baby and Me Booklet".  After visit meds:    Medication List    TAKE these medications   ibuprofen 600 MG tablet Commonly known as:  ADVIL,MOTRIN Take 1  tablet (600 mg total) by mouth every 6 (six) hours.   oxyCODONE 5 MG immediate release tablet Commonly known as:  Oxy IR/ROXICODONE Take 1 tablet (5 mg total) by mouth every 4 (four) hours as needed (pain scale 4-7).   prenatal multivitamin Tabs tablet Take 1 tablet by mouth daily at 12 noon.       Diet: routine diet  Activity: Advance as tolerated. Pelvic rest for 6 weeks.   Outpatient follow up:6 weeks Follow up Appt:No future appointments. Follow up Visit:No Follow-up on file.  Postpartum contraception: Undecided  Newborn Data: Live born female  Birth Weight: 7 lb 7.9 oz (3400 g) APGAR: 7, 9  Baby Feeding: Breast Disposition:home with mother   03/15/2016 Oliver Pila, MD

## 2016-03-15 NOTE — Progress Notes (Signed)
Discussed with patient and husband about getting the MMR vaccine.  They had received the information but wanted to wait and think about it a little longer.  They declined receiving the vaccine in the hospital.  They understood that they could follow up with their OB at the next visit if they changed their mind. 

## 2016-03-15 NOTE — Lactation Note (Signed)
This note was copied from a baby's chart. Lactation Consultation Note  Patient Name: Jamie Jordan NWGNF'AToday's Date: 03/15/2016 Reason for consult: Follow-up assessment  2nd visit for LC to check latch. LC re - positioned baby's hips so breast tissue would mold  In the baby's mouth better , increased swallows noted and per mom comfortable.   Maternal Data    Feeding Feeding Type:  (LC obs feeding / repositioned baby's hips to allow milk flow) Length of feed:  (multiply swallows noted )  LATCH Score/Interventions Latch: Grasps breast easily, tongue down, lips flanged, rhythmical sucking.  Audible Swallowing: A few with stimulation  Type of Nipple: Everted at rest and after stimulation  Comfort (Breast/Nipple): Soft / non-tender     Hold (Positioning): No assistance needed to correctly position infant at breast.  LATCH Score: 9  Lactation Tools Discussed/Used     Consult Status Consult Status: Complete Date: 03/15/16    Kathrin Greathouseorio, Cassy Sprowl Ann 03/15/2016, 11:18 AM

## 2016-11-30 ENCOUNTER — Encounter: Payer: Self-pay | Admitting: Family Medicine

## 2016-11-30 ENCOUNTER — Ambulatory Visit (INDEPENDENT_AMBULATORY_CARE_PROVIDER_SITE_OTHER): Payer: Managed Care, Other (non HMO) | Admitting: Family Medicine

## 2016-11-30 VITALS — BP 100/78 | HR 70 | Temp 98.8°F | Ht 63.0 in | Wt 133.9 lb

## 2016-11-30 DIAGNOSIS — R059 Cough, unspecified: Secondary | ICD-10-CM

## 2016-11-30 DIAGNOSIS — J329 Chronic sinusitis, unspecified: Secondary | ICD-10-CM | POA: Diagnosis not present

## 2016-11-30 DIAGNOSIS — R05 Cough: Secondary | ICD-10-CM | POA: Diagnosis not present

## 2016-11-30 MED ORDER — FLUTICASONE PROPIONATE 50 MCG/ACT NA SUSP: 2.0000 | Freq: Every day | NASAL | 6 refills | Status: DC

## 2016-11-30 NOTE — Progress Notes (Signed)
HPI:  Acute visit for cough: -started several months ago -kid in daycare and once cold after another -she took zpack for strep throat 2 weeks ago -symptoms: chronic clear rhinorrhea, PND, cough, sneezing -had fever for a few days with strep but otherwise no fever, denies SOB, hemoptysis, wheezing, weight loss  ROS: See pertinent positives and negatives per HPI.  Past Medical History:  Diagnosis Date  . Headache   . Medical history non-contributory     Past Surgical History:  Procedure Laterality Date  . DILATION AND EVACUATION N/A 03/03/2015   Procedure: DILATATION AND EVACUATION;  Surgeon: Huel CoteKathy Richardson, MD;  Location: [facility];  Service: Gynecology;  Laterality: N/A;  . NO PAST SURGERIES      Family History  Problem Relation Age of Onset  . Diabetes Maternal Grandmother     Social History   Social History  . Marital status: Married    Spouse name: N/A  . Number of children: N/A  . Years of education: N/A   Social History Main Topics  . Smoking status: Never Smoker  . Smokeless tobacco: Never Used  . Alcohol use Yes     Comment: occ  . Drug use: No  . Sexual activity: Yes   Other Topics Concern  . 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  fluticasone (FLONASE) 50 MCG/ACT nasal spray, Place 2 sprays into both nostrils daily., Disp: 16 g, Rfl: 6  EXAM:  Vitals:   11/30/16 1537  BP: 100/78  Pulse: 70  Temp: 98.8 F (37.1 C)    Body mass index is 23.72 kg/m.  GENERAL: vitals reviewed and listed above, alert, oriented, appears well hydrated and in no acute distress  HEENT: atraumatic, conjunttiva clear, no obvious abnormalities on inspection of external nose and ears, normal appearance of ear canals and TMs, clear nasal  congestion, boggy swollen turbinates, mild post oropharyngeal erythema with PND, no tonsillar edema or exudate, no sinus TTP  NECK: no obvious masses on inspection  LUNGS: clear to auscultation bilaterally, no wheezes, rales or rhonchi, good air movement  CV: HRRR, no peripheral edema  MS: moves all extremities without noticeable abnormality  PSYCH: pleasant and cooperative, no obvious depression or anxiety  ASSESSMENT AND PLAN:  Discussed the following assessment and plan:  Rhinosinusitis  Cough - Plan: DG Chest 2 View  -CXR given chronicity cough -suspect PND from viral illnesses vs underlying allergies or combo both -trial INS and antihistamine -follow up 1 month -Patient advised to return or notify a doctor immediately if symptoms worsen or persist or new concerns arise.  Patient Instructions  BEFORE YOU LEAVE: -follow up: in 1 month -xray sheet  Start zyrtec nightly.  Flonase 2 sprays each nostril daily.  Get the xray.  I hope you are feeling better soon! Seek care sooner if worsening, new concerns or you are not improving with treatment.  WE NOW OFFER   [facility] FAST TRACK!!!  SAME DAY Appointments for ACUTE CARE  Such as: Sprains, Injuries, cuts, abrasions, rashes, muscle pain, joint pain, back pain Colds, flu, sore throats, headache, allergies, cough, fever  Ear pain, sinus and eye infections Abdominal pain, nausea, vomiting, diarrhea, upset  stomach Animal/insect bites  3 Easy Ways to Schedule: Walk-In Scheduling Call in scheduling Mychart Sign-up: [email or web address]              Kriste BasqueKIM, Shene Maxfield R., DO

## 2016-11-30 NOTE — Patient Instructions (Signed)
BEFORE YOU LEAVE: -follow up: in 1 month -xray sheet  Start zyrtec nightly.  Flonase 2 sprays each nostril daily.  Get the xray.  I hope you are feeling better soon! Seek care sooner if worsening, new concerns or you are not improving with treatment.  WE NOW OFFER   [facility] FAST TRACK!!!  SAME DAY Appointments for ACUTE CARE  Such as: Sprains, Injuries, cuts, abrasions, rashes, muscle pain, joint pain, back pain Colds, flu, sore throats, headache, allergies, cough, fever  Ear pain, sinus and eye infections Abdominal pain, nausea, vomiting, diarrhea, upset stomach Animal/insect bites  3 Easy Ways to Schedule: Walk-In Scheduling Call in scheduling Mychart Sign-up: [email or web address]

## 2016-12-01 ENCOUNTER — Ambulatory Visit (INDEPENDENT_AMBULATORY_CARE_PROVIDER_SITE_OTHER)
Admission: RE | Admit: 2016-12-01 | Discharge: 2016-12-01 | Disposition: A | Discharge status: Home / Self Care | Payer: Managed Care, Other (non HMO) | Source: Ambulatory Visit | Attending: Family Medicine | Admitting: Family Medicine

## 2016-12-01 DIAGNOSIS — R059 Cough, unspecified: Secondary | ICD-10-CM

## 2016-12-01 DIAGNOSIS — R05 Cough: Secondary | ICD-10-CM | POA: Diagnosis not present

## 2017-07-07 ENCOUNTER — Encounter: Payer: Self-pay | Admitting: Family Medicine

## 2017-07-07 ENCOUNTER — Ambulatory Visit: Payer: Managed Care, Other (non HMO) | Admitting: Family Medicine

## 2017-07-07 VITALS — BP 104/70 | HR 80 | Temp 98.3°F | Resp 14 | Ht 63.0 in | Wt 134.0 lb

## 2017-07-07 DIAGNOSIS — J029 Acute pharyngitis, unspecified: Secondary | ICD-10-CM | POA: Diagnosis not present

## 2017-07-07 DIAGNOSIS — J324 Chronic pansinusitis: Secondary | ICD-10-CM

## 2017-07-07 MED ORDER — FLUTICASONE PROPIONATE 50 MCG/ACT NA SUSP: 2.0000 | Freq: Every day | NASAL | 6 refills | Status: DC

## 2017-07-07 MED ORDER — PROMETHAZINE-DM 6.25-15 MG/5ML PO SYRP: 5.0000 mL | ORAL_SOLUTION | Freq: Four times a day (QID) | ORAL | 0 refills | Status: DC | PRN

## 2017-07-07 MED ORDER — AMOXICILLIN-POT CLAVULANATE 875-125 MG PO TABS: 1.0000 | ORAL_TABLET | Freq: Two times a day (BID) | ORAL | 0 refills | Status: DC

## 2017-07-07 NOTE — Progress Notes (Signed)
Pre visit review using our clinic review tool, if applicable. No additional management support is needed unless otherwise documented below in the visit note. 

## 2017-07-07 NOTE — Addendum Note (Signed)
Addended by: Azucena FreedMILLNER, Kylin Genna C on: 07/07/2017 11:58 AM   Modules accepted: Orders

## 2017-07-07 NOTE — Progress Notes (Signed)
Patient ID: Jamie Jordan, female    DOB: 09/19/1985  Age: 31 y.o. MRN: [id number]    Subjective:  Subjective  HPI Jamie Jordan presents for cough, congestion x 1 month.  Cough is dry.  No sore throat.  Her son is being treated for strep.  No fever.   Pt has been taking benadryl and Ib with no relief.    Review of Systems  Constitutional: Negative for chills and fever.  HENT: Positive for congestion, postnasal drip, rhinorrhea and sinus pressure.   Respiratory: Positive for cough and wheezing. Negative for chest tightness and shortness of breath.   Cardiovascular: Negative for chest pain, palpitations and leg swelling.  Allergic/Immunologic: Negative for environmental allergies.    History Past Medical History:  Diagnosis Date  . Headache   . Medical history non-contributory     She has a past surgical history that includes No past surgeries and Dilation and evacuation (N/A, 03/03/2015).   Her family history includes Diabetes in her maternal grandmother.She reports that  has never smoked. she has never used smokeless tobacco. She reports that she drinks alcohol. She reports that she does not use drugs.  Current Outpatient Medications on File Prior to Visit  Medication Sig Dispense Refill  . fluticasone (FLONASE) 50 MCG/ACT nasal spray Place 2 sprays into both nostrils daily. 16 g 6   No current facility-administered medications on file prior to visit.      Objective:  Objective  Physical Exam  Constitutional: She is oriented to person, place, and time. She appears well-developed and well-nourished.  HENT:  Right Ear: External ear normal.  Left Ear: External ear normal.  Nose: Right sinus exhibits maxillary sinus tenderness and frontal sinus tenderness. Left sinus exhibits maxillary sinus tenderness and frontal sinus tenderness.  Mouth/Throat: Posterior oropharyngeal erythema present. No oropharyngeal exudate or posterior oropharyngeal edema.  + PND + errythema  Eyes:  Conjunctivae are normal. Right eye exhibits no discharge. Left eye exhibits no discharge.  Cardiovascular: Normal rate, regular rhythm and normal heart sounds.  No murmur heard. Pulmonary/Chest: Effort normal and breath sounds normal. No respiratory distress. She has no wheezes. She has no rales. She exhibits no tenderness.  Musculoskeletal: She exhibits no edema.  Lymphadenopathy:    She has cervical adenopathy.  Neurological: She is alert and oriented to person, place, and time.  Nursing note and vitals reviewed.  BP 104/70 (BP Location: Left Arm, Patient Position: Sitting, Cuff Size: Normal)   Pulse 80   Temp 98.3 F (36.8 C) (Oral)   Resp 14   Ht 5\' 3"  (1.6 m)   Wt 134 lb (60.8 kg)   SpO2 98%   BMI 23.74 kg/m  Wt Readings from Last 3 Encounters:  07/07/17 134 lb (60.8 kg)  11/30/16 133 lb 14.4 oz (60.7 kg)  03/13/16 153 lb (69.4 kg)     Lab Results  Component Value Date   WBC 17.2 (H) 03/14/2016   HGB 13.0 03/14/2016   HCT 37.7 03/14/2016   PLT 217 03/14/2016   GLUCOSE 78 10/24/2011   CHOL 146 10/13/2014   TRIG 51.0 10/13/2014   HDL 57.60 10/13/2014   LDLCALC 78 10/13/2014   ALT 13 10/24/2011   AST 19 10/24/2011   NA 138 10/24/2011   K 4.0 10/24/2011   CL 104 10/24/2011   CREATININE 0.6 10/24/2011   BUN 11 10/24/2011   CO2 27 10/24/2011   HGBA1C 5.3 10/13/2014    Dg Chest 2 View  Result Date: 12/01/2016 CLINICAL DATA:  Cough and congestion for 2 days EXAM: CHEST  2 VIEW COMPARISON:  None. FINDINGS: The heart size and mediastinal contours are within normal limits. Both lungs are clear. The visualized skeletal structures are unremarkable. IMPRESSION: No active cardiopulmonary disease. Electronically Signed   By: Jasmine PangKim  Fujinaga M.D.   On: 12/01/2016 16:01     Assessment & Plan:  Plan  I am having Jamie Jordan start on amoxicillin-clavulanate, promethazine-dextromethorphan, and fluticasone. I am also having her maintain her fluticasone.  Meds ordered this  encounter  Medications  . amoxicillin-clavulanate (AUGMENTIN) 875-125 MG tablet    Sig: Take 1 tablet by mouth 2 (two) times daily.    Dispense:  20 tablet    Refill:  0  . promethazine-dextromethorphan (PROMETHAZINE-DM) 6.25-15 MG/5ML syrup    Sig: Take 5 mLs by mouth 4 (four) times daily as needed.    Dispense:  118 mL    Refill:  0  . fluticasone (FLONASE) 50 MCG/ACT nasal spray    Sig: Place 2 sprays into both nostrils daily.    Dispense:  16 g    Refill:  6    Problem List Items Addressed This Visit    None    Visit Diagnoses    Pansinusitis, unspecified chronicity    -  Primary   Relevant Medications   amoxicillin-clavulanate (AUGMENTIN) 875-125 MG tablet   promethazine-dextromethorphan (PROMETHAZINE-DM) 6.25-15 MG/5ML syrup   fluticasone (FLONASE) 50 MCG/ACT nasal spray    Jamie Jordan  Follow-up: Return if symptoms worsen or fail to improve.  Donato SchultzYvonne R Lowne Chase, DO

## 2017-07-07 NOTE — Patient Instructions (Signed)

## 2017-09-08 ENCOUNTER — Encounter: Payer: Self-pay | Admitting: Internal Medicine

## 2017-09-08 ENCOUNTER — Ambulatory Visit: Payer: Managed Care, Other (non HMO) | Admitting: Internal Medicine

## 2017-09-08 ENCOUNTER — Ambulatory Visit (HOSPITAL_COMMUNITY)
Admission: RE | Admit: 2017-09-08 | Discharge: 2017-09-08 | Disposition: A | Discharge status: Home / Self Care | Payer: Managed Care, Other (non HMO) | Source: Ambulatory Visit | Attending: Internal Medicine | Admitting: Internal Medicine

## 2017-09-08 VITALS — BP 112/62 | HR 95 | Temp 98.1°F | Ht 63.0 in | Wt 134.1 lb

## 2017-09-08 DIAGNOSIS — J019 Acute sinusitis, unspecified: Secondary | ICD-10-CM

## 2017-09-08 DIAGNOSIS — R05 Cough: Secondary | ICD-10-CM | POA: Diagnosis present

## 2017-09-08 DIAGNOSIS — R918 Other nonspecific abnormal finding of lung field: Secondary | ICD-10-CM | POA: Insufficient documentation

## 2017-09-08 DIAGNOSIS — R509 Fever, unspecified: Secondary | ICD-10-CM

## 2017-09-08 DIAGNOSIS — R059 Cough, unspecified: Secondary | ICD-10-CM

## 2017-09-08 MED ORDER — DOXYCYCLINE HYCLATE 100 MG PO TABS: 100.0000 mg | ORAL_TABLET | Freq: Two times a day (BID) | ORAL | 0 refills | Status: DC

## 2017-09-08 NOTE — Progress Notes (Signed)
Chief Complaint  Patient presents with  . Cough    HPI: Jamie Jordan 32 y.o. come in for  Patient comes in today for [facility] Saturday clinic for  new problem evaluation. 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   .  And cough  persiste not sob but co sore throat and  Green nasal dranaige  hea off and on nasal and top fo head   No face pain otherwise Cough hurts in throat .    No vomiting diarrhea.   Home  Has 34 mos old.  And  18 mos  Old at home  In day care.  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   F ROS: See pertinent positives and negatives per HPI.  lmp  Early February. Condoms  No vomiting diarrhea rash  uti sx   Past Medical History:  Diagnosis Date  . Headache   . Medical history non-contributory     Family History  Problem Relation Age of Onset  . Diabetes Maternal Grandmother     Social History   Socioeconomic History  . Marital status: Married    Spouse name: None  . Number of children: None  . Years of education: None  . Highest education level: None  Social Needs  . Financial resource strain: None  . Food insecurity - worry: None  . Food insecurity - inability: None  . Transportation needs - medical: None  . Transportation needs - non-medical: None  Occupational History  . None  Tobacco Use  . Smoking status: Never Smoker  . Smokeless tobacco: Never Used  Substance and Sexual Activity  . Alcohol use: Yes    Comment: occ  . Drug use: No  . Sexual activity: Yes  Other Topics Concern  . 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 amoxicillin-clavulanate (AUGMENTIN) 875-125 MG tablet Take 1 tablet by mouth 2 (two) times daily. (Patient not taking: Reported on 09/08/2017) 20 tablet 0  . fluticasone (FLONASE) 50 MCG/ACT nasal spray Place 2 sprays into both nostrils daily. (Patient not taking: Reported on 09/08/2017) 16 g 6  . fluticasone (FLONASE) 50 MCG/ACT nasal spray Place 2 sprays into both nostrils daily. (Patient not taking: Reported on 09/08/2017) 16 g 6  . promethazine-dextromethorphan (PROMETHAZINE-DM) 6.25-15 MG/5ML syrup Take 5 mLs by mouth 4 (four) times daily as needed. (Patient not taking: Reported on 09/08/2017) 118 mL 0   No facility-administered medications prior to visit.      EXAM:  BP 112/62   Pulse 95   Temp 98.1 F (36.7 C)   Ht 5\' 3"  (1.6 m)   Wt 134 lb 1.3 oz (60.8 kg)   SpO2 99%   BMI 23.75 kg/m   Body mass index is 23.75 kg/m. WDWN in NAD  quiet  respirations; mildly congested  somewhat hoarse. Non toxic . Tired  ocass cough  HEENT: Normocephalic ;atraumatic , Eyes;  PERRL, EOMs  Full, lids and conjunctiva clear,,Ears: no deformities, canals nl, TM landmarks normal, Nose: no deformity or discharge but congested;face minimally tender Mouth : OP clear without lesion or edema . Neck: Supple without adenopathy or masses or bruits Chest:  C ? Dec bs left base  No wheezing  CV:  S1-S2 no gallops or murmurs peripheral perfusion is normal Skin :nl perfusion and no acute rashes  Abdomen:  Sof,t normal bowel sounds without hepatosplenomegaly, no guarding rebound or masses no CVA tenderness   BP Readings from Last 3 Encounters:  09/08/17 112/62  07/07/17 104/70  11/30/16 100/78    ASSESSMENT AND PLAN:  Discussed the following assessment and plan:  Cough with fever - relapsing fever  concern for airspace disease ? lll  - Plan: DG Chest 2 View  Acute sinusitis, recurrence not  specified, unspecified location - fever  after a week of illness  headache not impressive but green nasal drainage present Risk benefit of antibiotic  Concern about developing pna? Vs new illness   Get x ray and notify by my chart about plan  consdier rx for bacterial sinuitis   -Patient advised to return or notify health care team  if  new concerns arise.  x ray atelectasis   No pna   Will rx with doxy  Record revewied was rx fo sinusitus in December    Patient Instructions   Exam   Sounds left lower  lung field  .  Get chest x ray  To check for  Pneumonia   Will let you know  By my chart  And  Make a plan  You may have a bacterial sinusitis brewing  As a secondary infection    Either way fever should be gone in the next 48 hours and if not  Plan fu advice from pcp etc.    .      Neta MendsWanda K. Shavelle Runkel M.D.

## 2017-09-08 NOTE — Patient Instructions (Addendum)
  Exam   Sounds left lower  lung field  .  Get chest x ray  To check for  Pneumonia   Will let you know  By my chart  And  Make a plan  You may have a bacterial sinusitis brewing  As a secondary infection    Either way fever should be gone in the next 48 hours and if not  Plan fu advice from pcp etc.    .

## 2017-12-28 NOTE — Telephone Encounter (Signed)
Preadmission screen  

## 2018-02-04 ENCOUNTER — Telehealth: Payer: Self-pay | Admitting: Family Medicine

## 2018-02-04 NOTE — Telephone Encounter (Signed)
I called the pt and asked her the criteria questions for the MMR vaccine.  She answered no to all of the questions and I advised her she would need an appt to see the doctor and she is due for a physical since seen over a year ago.  I advised her the charge for the office is $130 and to call her insurance company to check for coverage.  She stated she called and was told preventative care is covered and she would only have a copay. I advised her to ask the insurance specifically for the MMR vaccine or shot as the diagnosis code for this is not preventative. Patient stated she will call back for an appt.

## 2018-02-04 NOTE — Telephone Encounter (Signed)
Duplicate see prior message.

## 2018-02-04 NOTE — Telephone Encounter (Signed)
Copied from [place] [phone]. Topic: Appointment Scheduling - Prior Auth Required for Appointment >> Feb 04, 2018  4:03 PM Nils Flack wrote: No appointment has been scheduled. Patient is requesting MMR appointment. Per scheduling protocol, this appointment requires a prior authorization prior to scheduling. She want to know if insurance covers this and how much is costs. Cb is [phone] She needs this for legal green card purposes., pt has had measles in the past, but it didn't show up on the titer.    Route to department's PEC pool.

## 2018-02-04 NOTE — Telephone Encounter (Signed)
Copied from [place] [phone]. Topic: Appointment Scheduling - Prior Auth Required for Appointment >> Feb 04, 2018  4:03 PM Nils Flack wrote: No appointment has been scheduled. Patient is requesting MMR appointment. Per scheduling protocol, this appointment requires a prior authorization prior to scheduling. She want to know if insurance covers this and how much is costs. Cb is [phone] She needs this for legal green card purposes., pt has had measles in the past, but it didn't show up on the titer.    Route to department's PEC pool.

## 2019-07-18 NOTE — L&D Delivery Note (Signed)
Delivery Note Pt progressed rapidly to complete dilation and pushed about 10 minutes.  At 11:35 AM a healthy female was delivered via Vaginal, Spontaneous (Presentation: Right Occiput Anterior).  APGAR: 8, 9; weight pending .   Placenta status: delivered spontaneously.  Cord: tight double nuchal cord, reduced after delivery.    Anesthesia: Epidural Episiotomy: None Lacerations: 2nd degree Suture Repair: 3.0 vicryl rapide Est. Blood Loss (mL):  Mom to postpartum.  Baby to Couplet care / Skin to Skin. D/w pt circumcision and they decline  Oliver Pila 01/11/2020, 12:06 PM

## 2019-07-23 ENCOUNTER — Ambulatory Visit: Payer: BC Managed Care – PPO | Attending: Internal Medicine

## 2019-07-23 DIAGNOSIS — Z20822 Contact with and (suspected) exposure to covid-19: Secondary | ICD-10-CM

## 2019-07-25 LAB — NOVEL CORONAVIRUS, NAA: SARS-CoV-2, NAA: NOT DETECTED

## 2019-10-17 ENCOUNTER — Ambulatory Visit: Payer: BC Managed Care – PPO | Attending: Internal Medicine

## 2019-10-17 ENCOUNTER — Ambulatory Visit: Payer: BC Managed Care – PPO

## 2019-10-17 DIAGNOSIS — Z23 Encounter for immunization: Secondary | ICD-10-CM

## 2019-10-17 NOTE — Progress Notes (Signed)
   Covid-19 Vaccination Clinic  Name:  Jamie Jordan    MRN: [id number] DOB: [birth year]  10/17/2019  [name] was observed post Covid-19 immunization for 15 minutes without incident. She was provided with Vaccine Information Sheet and instruction to access the V-Safe system.   [name] was instructed to call 911 with any severe reactions post vaccine: Marland Kitchen Difficulty breathing  . Swelling of face and throat  . A fast heartbeat  . A bad rash all over body  . Dizziness and weakness   Immunizations Administered    Name Date Dose VIS Date Route   Pfizer COVID-19 Vaccine 10/17/2019  9:40 AM 0.3 mL 06/27/2019 Intramuscular   Manufacturer: [employer], [employer]   Lot: [id number]   NDC: [address]-0

## 2019-11-10 ENCOUNTER — Ambulatory Visit: Payer: BC Managed Care – PPO | Attending: Internal Medicine

## 2019-11-10 DIAGNOSIS — Z23 Encounter for immunization: Secondary | ICD-10-CM

## 2019-11-10 NOTE — Progress Notes (Signed)
   Covid-19 Vaccination Clinic  Name:  Jamie Jordan    MRN: [id number] DOB: October 24, 1985  11/10/2019  Ms. Berrong was observed post Covid-19 immunization for 15 minutes without incident. She was provided with Vaccine Information Sheet and instruction to access the V-Safe system.   Ms. Felipe was instructed to call 911 with any severe reactions post vaccine: Marland Kitchen Difficulty breathing  . Swelling of face and throat  . A fast heartbeat  . A bad rash all over body  . Dizziness and weakness   Immunizations Administered    Name Date Dose VIS Date Route   Pfizer COVID-19 Vaccine 11/10/2019  4:32 PM 0.3 mL 09/10/2018 Intramuscular   Manufacturer: [employer], [employer]   Lot: [id number]   NDC: [address]-4

## 2020-01-11 ENCOUNTER — Inpatient Hospital Stay (HOSPITAL_COMMUNITY): Payer: BC Managed Care – PPO | Admitting: Anesthesiology

## 2020-01-11 ENCOUNTER — Encounter (HOSPITAL_COMMUNITY): Payer: Self-pay

## 2020-01-11 ENCOUNTER — Other Ambulatory Visit: Payer: Self-pay

## 2020-01-11 ENCOUNTER — Inpatient Hospital Stay (HOSPITAL_COMMUNITY)
Admission: AD | Admit: 2020-01-11 | Discharge: 2020-01-13 | DRG: 807 | Disposition: A | Discharge status: Home / Self Care | Payer: BC Managed Care – PPO | Attending: Obstetrics and Gynecology | Admitting: Obstetrics and Gynecology

## 2020-01-11 DIAGNOSIS — Z3A38 38 weeks gestation of pregnancy: Secondary | ICD-10-CM

## 2020-01-11 DIAGNOSIS — Z20822 Contact with and (suspected) exposure to covid-19: Secondary | ICD-10-CM | POA: Diagnosis present

## 2020-01-11 DIAGNOSIS — O26893 Other specified pregnancy related conditions, third trimester: Secondary | ICD-10-CM | POA: Diagnosis present

## 2020-01-11 LAB — CBC
HCT: 39.7 % (ref 36.0–46.0)
Hemoglobin: 13.2 g/dL (ref 12.0–15.0)
MCH: 31.4 pg (ref 26.0–34.0)
MCHC: 33.2 g/dL (ref 30.0–36.0)
MCV: 94.5 fL (ref 80.0–100.0)
Platelets: 266 10*3/uL (ref 150–400)
RBC: 4.2 MIL/uL (ref 3.87–5.11)
RDW: 12.5 % (ref 11.5–15.5)
WBC: 9.2 10*3/uL (ref 4.0–10.5)
nRBC: 0 % (ref 0.0–0.2)

## 2020-01-11 LAB — ABO/RH: ABO/RH(D): O POS

## 2020-01-11 LAB — RPR: RPR Ser Ql: NONREACTIVE

## 2020-01-11 LAB — TYPE AND SCREEN
ABO/RH(D): O POS
Antibody Screen: NEGATIVE

## 2020-01-11 LAB — SARS CORONAVIRUS 2 BY RT PCR (HOSPITAL ORDER, PERFORMED IN ~~LOC~~ HOSPITAL LAB): SARS Coronavirus 2: NEGATIVE

## 2020-01-11 MED ORDER — PHENYLEPHRINE 40 MCG/ML (10ML) SYRINGE FOR IV PUSH (FOR BLOOD PRESSURE SUPPORT)
80.0000 ug | PREFILLED_SYRINGE | INTRAVENOUS | Status: DC | PRN
  Filled 2020-01-11: qty 10

## 2020-01-11 MED ORDER — BENZOCAINE-MENTHOL 20-0.5 % EX AERO
1.0000 "application " | INHALATION_SPRAY | CUTANEOUS | Status: DC | PRN
  Administered 2020-01-11: 1 via TOPICAL
  Filled 2020-01-11: qty 56

## 2020-01-11 MED ORDER — WITCH HAZEL-GLYCERIN EX PADS: 1.0000 "application " | MEDICATED_PAD | CUTANEOUS | Status: DC | PRN

## 2020-01-11 MED ORDER — IBUPROFEN 600 MG PO TABS
600.0000 mg | ORAL_TABLET | Freq: Four times a day (QID) | ORAL | Status: DC
  Administered 2020-01-11 – 2020-01-13 (×8): 600 mg via ORAL
  Filled 2020-01-11 (×8): qty 1

## 2020-01-11 MED ORDER — SENNOSIDES-DOCUSATE SODIUM 8.6-50 MG PO TABS
2.0000 | ORAL_TABLET | ORAL | Status: DC
  Administered 2020-01-11 – 2020-01-12 (×2): 2 via ORAL
  Filled 2020-01-11 (×2): qty 2

## 2020-01-11 MED ORDER — ONDANSETRON HCL 4 MG/2ML IJ SOLN: 4.0000 mg | INTRAMUSCULAR | Status: DC | PRN

## 2020-01-11 MED ORDER — DIBUCAINE (PERIANAL) 1 % EX OINT: 1.0000 "application " | TOPICAL_OINTMENT | CUTANEOUS | Status: DC | PRN

## 2020-01-11 MED ORDER — LACTATED RINGERS IV SOLN: 500.0000 mL | INTRAVENOUS | Status: DC | PRN

## 2020-01-11 MED ORDER — COCONUT OIL OIL: 1.0000 "application " | TOPICAL_OIL | Status: DC | PRN

## 2020-01-11 MED ORDER — LACTATED RINGERS IV SOLN
500.0000 mL | Freq: Once | INTRAVENOUS | Status: AC
  Administered 2020-01-11: 500 mL via INTRAVENOUS

## 2020-01-11 MED ORDER — OXYTOCIN-SODIUM CHLORIDE 30-0.9 UT/500ML-% IV SOLN
1.0000 m[IU]/min | INTRAVENOUS | Status: DC
  Administered 2020-01-11: 2 m[IU]/min via INTRAVENOUS

## 2020-01-11 MED ORDER — EPHEDRINE 5 MG/ML INJ
10.0000 mg | INTRAVENOUS | Status: DC | PRN
  Administered 2020-01-11: 10 mg via INTRAVENOUS

## 2020-01-11 MED ORDER — LIDOCAINE HCL (PF) 1 % IJ SOLN: 30.0000 mL | INTRAMUSCULAR | Status: DC | PRN

## 2020-01-11 MED ORDER — ACETAMINOPHEN 325 MG PO TABS: 650.0000 mg | ORAL_TABLET | ORAL | Status: DC | PRN

## 2020-01-11 MED ORDER — PRENATAL MULTIVITAMIN CH
1.0000 | ORAL_TABLET | Freq: Every day | ORAL | Status: DC
  Administered 2020-01-12 – 2020-01-13 (×2): 1 via ORAL
  Filled 2020-01-11 (×2): qty 1

## 2020-01-11 MED ORDER — SOD CITRATE-CITRIC ACID 500-334 MG/5ML PO SOLN: 30.0000 mL | ORAL | Status: DC | PRN

## 2020-01-11 MED ORDER — DIPHENHYDRAMINE HCL 50 MG/ML IJ SOLN: 12.5000 mg | INTRAMUSCULAR | Status: DC | PRN

## 2020-01-11 MED ORDER — ZOLPIDEM TARTRATE 5 MG PO TABS: 5.0000 mg | ORAL_TABLET | Freq: Every evening | ORAL | Status: DC | PRN

## 2020-01-11 MED ORDER — PHENYLEPHRINE 40 MCG/ML (10ML) SYRINGE FOR IV PUSH (FOR BLOOD PRESSURE SUPPORT)
80.0000 ug | PREFILLED_SYRINGE | INTRAVENOUS | Status: DC | PRN
  Administered 2020-01-11 (×2): 80 ug via INTRAVENOUS

## 2020-01-11 MED ORDER — FLEET ENEMA 7-19 GM/118ML RE ENEM: 1.0000 | ENEMA | RECTAL | Status: DC | PRN

## 2020-01-11 MED ORDER — OXYTOCIN-SODIUM CHLORIDE 30-0.9 UT/500ML-% IV SOLN
2.5000 [IU]/h | INTRAVENOUS | Status: DC
  Administered 2020-01-11: 2.5 [IU]/h via INTRAVENOUS
  Filled 2020-01-11: qty 500

## 2020-01-11 MED ORDER — ONDANSETRON HCL 4 MG/2ML IJ SOLN: 4.0000 mg | Freq: Four times a day (QID) | INTRAMUSCULAR | Status: DC | PRN

## 2020-01-11 MED ORDER — OXYCODONE-ACETAMINOPHEN 5-325 MG PO TABS: 1.0000 | ORAL_TABLET | ORAL | Status: DC | PRN

## 2020-01-11 MED ORDER — TERBUTALINE SULFATE 1 MG/ML IJ SOLN: 0.2500 mg | Freq: Once | INTRAMUSCULAR | Status: DC | PRN

## 2020-01-11 MED ORDER — SIMETHICONE 80 MG PO CHEW: 80.0000 mg | CHEWABLE_TABLET | ORAL | Status: DC | PRN

## 2020-01-11 MED ORDER — OXYTOCIN BOLUS FROM INFUSION
333.0000 mL | Freq: Once | INTRAVENOUS | Status: AC
  Administered 2020-01-11: 333 mL via INTRAVENOUS

## 2020-01-11 MED ORDER — ACETAMINOPHEN 325 MG PO TABS
650.0000 mg | ORAL_TABLET | ORAL | Status: DC | PRN
  Administered 2020-01-12 (×2): 650 mg via ORAL
  Filled 2020-01-11 (×2): qty 2

## 2020-01-11 MED ORDER — FENTANYL-BUPIVACAINE-NACL 0.5-0.125-0.9 MG/250ML-% EP SOLN
12.0000 mL/h | EPIDURAL | Status: DC | PRN
  Filled 2020-01-11: qty 250

## 2020-01-11 MED ORDER — SODIUM CHLORIDE (PF) 0.9 % IJ SOLN
INTRAMUSCULAR | Status: DC | PRN
  Administered 2020-01-11: 12 mL/h via EPIDURAL

## 2020-01-11 MED ORDER — LIDOCAINE HCL (PF) 1 % IJ SOLN
INTRAMUSCULAR | Status: DC | PRN
  Administered 2020-01-11: 11 mL via EPIDURAL

## 2020-01-11 MED ORDER — DIPHENHYDRAMINE HCL 25 MG PO CAPS: 25.0000 mg | ORAL_CAPSULE | Freq: Four times a day (QID) | ORAL | Status: DC | PRN

## 2020-01-11 MED ORDER — TETANUS-DIPHTH-ACELL PERTUSSIS 5-2.5-18.5 LF-MCG/0.5 IM SUSP: 0.5000 mL | Freq: Once | INTRAMUSCULAR | Status: DC

## 2020-01-11 MED ORDER — EPHEDRINE 5 MG/ML INJ
10.0000 mg | INTRAVENOUS | Status: DC | PRN
  Filled 2020-01-11: qty 10

## 2020-01-11 MED ORDER — LACTATED RINGERS IV SOLN: INTRAVENOUS | Status: DC

## 2020-01-11 MED ORDER — ONDANSETRON HCL 4 MG PO TABS: 4.0000 mg | ORAL_TABLET | ORAL | Status: DC | PRN

## 2020-01-11 MED ORDER — OXYCODONE-ACETAMINOPHEN 5-325 MG PO TABS: 2.0000 | ORAL_TABLET | ORAL | Status: DC | PRN

## 2020-01-11 NOTE — Plan of Care (Signed)
PT DEMONSTRATED UNDERSTANDING 

## 2020-01-11 NOTE — Anesthesia Preprocedure Evaluation (Signed)
Anesthesia Evaluation  Patient identified by MRN, date of birth, ID band Patient awake    Reviewed: Allergy & Precautions, NPO status , Patient's Chart, lab work & pertinent test results  History of Anesthesia Complications Negative for: history of anesthetic complications  Airway Mallampati: II  TM Distance: >3 FB Neck ROM: Full    Dental no notable dental hx. (+) Dental Advisory Given   Pulmonary neg pulmonary ROS,    Pulmonary exam normal breath sounds clear to auscultation       Cardiovascular negative cardio ROS Normal cardiovascular exam Rhythm:Regular Rate:Normal     Neuro/Psych negative neurological ROS  negative psych ROS   GI/Hepatic negative GI ROS, Neg liver ROS,   Endo/Other  negative endocrine ROS  Renal/GU negative Renal ROS  negative genitourinary   Musculoskeletal negative musculoskeletal ROS (+)   Abdominal   Peds negative pediatric ROS (+)  Hematology negative hematology ROS (+)   Anesthesia Other Findings   Reproductive/Obstetrics (+) Pregnancy                             Anesthesia Physical  Anesthesia Plan  ASA: II  Anesthesia Plan: Epidural   Post-op Pain Management:    Induction:   PONV Risk Score and Plan:   Airway Management Planned:   Additional Equipment:   Intra-op Plan:   Post-operative Plan:   Informed Consent: I have reviewed the patients History and Physical, chart, labs and discussed the procedure including the risks, benefits and alternatives for the proposed anesthesia with the patient or authorized representative who has indicated his/her understanding and acceptance.   Dental advisory given  Plan Discussed with: CRNA  Anesthesia Plan Comments:         Anesthesia Quick Evaluation  

## 2020-01-11 NOTE — H&P (Signed)
Jamie Jordan is a 34 y.o. female G5P1031 at 40 2/7 weeks (EDD 01/23/20 by LMP c/w 9 week [place])  presenting for irregular contractions but cervical change to 6cm.   Prenatal care uncomplicated except some first trimester bleeding that resolved.   OB History    Gravida  4   Para  1   Term  1   Preterm      AB  2   Living  1     SAB  1   TAB  1   Ectopic      Multiple  0   Live Births  1         SAB x 1 EAB x 2 03-14-2016, 40.6 wks M, 7lbs 7oz, Vaginal Delivery  Past Medical History:  Diagnosis Date  . Headache   . Medical history non-contributory    Past Surgical History:  Procedure Laterality Date  . DILATION AND EVACUATION N/A 03/03/2015   Procedure: DILATATION AND EVACUATION;  Surgeon: Huel Cote, MD;  Location: [facility];  Service: Gynecology;  Laterality: N/A;  . NO PAST SURGERIES     Family History: family history includes Diabetes in her maternal grandmother. Social History:  reports that she has never smoked. She has never used smokeless tobacco. She reports current alcohol use. She reports that she does not use drugs.     Maternal Diabetes: No Genetic Screening: Normal Maternal Ultrasounds/Referrals: Normal Fetal Ultrasounds or other Referrals:  None Maternal Substance Abuse:  No Significant Maternal Medications:  None Significant Maternal Lab Results:  None Other Comments:  None  Review of Systems  Constitutional: Negative for fever.  Gastrointestinal: Positive for abdominal pain.  Musculoskeletal: Positive for back pain.   Maternal Medical History:  Reason for admission: Contractions.   Contractions: Onset was 3-5 hours ago.   Frequency: regular.   Perceived severity is moderate.    Fetal activity: Perceived fetal activity is normal.    Prenatal complications: no prenatal complications Prenatal Complications - Diabetes: none.    Dilation: 6 Effacement (%): 70 Station: -1 Exam by:: Dr Senaida Ores Blood pressure 120/85, pulse  86, temperature 98.2 F (36.8 C), temperature source Oral, resp. rate 16, height 5' 3.5" (1.613 m), weight 70.3 kg, last menstrual period 04/18/2019, SpO2 100 %. Maternal Exam:  Uterine Assessment: Contraction strength is moderate.  Contraction frequency is regular.   Abdomen: Patient reports no abdominal tenderness. Fetal presentation: vertex  Introitus: Normal vulva. Normal vagina.  Pelvis: adequate for delivery.      Physical Exam Constitutional:      Appearance: Normal appearance.  Cardiovascular:     Rate and Rhythm: Normal rate and regular rhythm.  Pulmonary:     Effort: Pulmonary effort is normal.  Abdominal:     Palpations: Abdomen is soft.     Comments: gravid  Genitourinary:    General: Normal vulva.  Neurological:     Mental Status: She is alert.  Psychiatric:        Mood and Affect: Mood normal.     Prenatal labs: ABO, Rh:  O positive Antibody:  negative Rubella:  Immune RPR:   NR HBsAg:   Neg HIV:   NR GBS:   Neg Hgb AA CF, Fragile X and SMA negative Panorama low risk female One hour GCT 100  Assessment/Plan: Pt admitted with irregular contractions but cervix dilated to 6 cm.  Will get epidural then plan AROM and augmentation with pitocin as needed.   Oliver Pila 01/11/2020, 8:10 AM

## 2020-01-11 NOTE — Lactation Note (Signed)
This note was copied from a baby's chart. Lactation Consultation Note  Patient Name: Boy Elzada Pytel Today's Date: 01/11/2020  P2, 10 hour ETI, less than 6 lbs at birth. Per RN, mom doesn't want LC services at this time, she will alert staff if she changes her mind.    Maternal Data    Feeding Feeding Type: Unknown  LATCH Score                   Interventions    Lactation Tools Discussed/Used     Consult Status      Danelle Earthly 01/11/2020, 9:48 PM

## 2020-01-11 NOTE — Anesthesia Procedure Notes (Signed)
Epidural Patient location during procedure: OB Start time: 01/11/2020 8:34 AM End time: 01/11/2020 8:46 AM  Staffing Anesthesiologist: Lowella Curb, MD Performed: anesthesiologist   Preanesthetic Checklist Completed: patient identified, IV checked, site marked, risks and benefits discussed, surgical consent, monitors and equipment checked, pre-op evaluation and timeout performed  Epidural Patient position: sitting Prep: ChloraPrep Patient monitoring: heart rate, cardiac monitor, continuous pulse ox and blood pressure Approach: midline Location: L2-L3 Injection technique: LOR saline  Needle:  Needle type: Tuohy  Needle gauge: 17 G Needle length: 9 cm Needle insertion depth: 4 cm Catheter type: closed end flexible Catheter size: 20 Guage Catheter at skin depth: 8 cm Test dose: negative  Assessment Events: blood not aspirated, injection not painful, no injection resistance, no paresthesia and negative IV test  Additional Notes Reason for block:procedure for pain

## 2020-01-11 NOTE — MAU Note (Signed)
Patient reports to MAU with complaints of back pain that comes and goes, patient also reports some VB, denies LOF, +movement.

## 2020-01-11 NOTE — Progress Notes (Signed)
Patient ID: Jamie Jordan, female   DOB: 1985/10/13, 34 y.o.   MRN: [id number] Pt received epidural and had some hypotension that responded to ephedrine and phenyleprine.  Baby HR had some prolonged decels with the hypotension but now recovered and back to 130 baseline and category 1  afeb VSS  Cervix 80/6-7/0  AROM clear, scant Contractions still 10 minutes apart so IUPC placed and will augment with pitocin Follow progress

## 2020-01-11 NOTE — Anesthesia Postprocedure Evaluation (Signed)
Anesthesia Post Note  Patient: Jamie Jordan  Procedure(s) Performed: AN AD HOC LABOR EPIDURAL     Patient location during evaluation: Mother Baby Anesthesia Type: Epidural Level of consciousness: awake and alert, oriented and patient cooperative Pain management: pain level controlled Vital Signs Assessment: post-procedure vital signs reviewed and stable Respiratory status: spontaneous breathing Cardiovascular status: stable Postop Assessment: no headache, epidural receding, patient able to bend at knees and no signs of nausea or vomiting Anesthetic complications: no Comments: Pt. Demonstrates bending knees 90 degrees.  States pain is manageable.  Pt. Reports receiving pain medication immediately prior to interview.    No complications documented.  Last Vitals:  Vitals:   01/11/20 1341 01/11/20 1447  BP: 117/79 119/80  Pulse: 66 71  Resp: 18 18  Temp: 36.9 C 36.8 C  SpO2: 99% 99%    Last Pain:  Vitals:   01/11/20 1610  TempSrc:   PainSc: 0-No pain   Pain Goal:                   [facility]

## 2020-01-12 LAB — CBC
HCT: 34.2 % — ABNORMAL LOW (ref 36.0–46.0)
Hemoglobin: 11.5 g/dL — ABNORMAL LOW (ref 12.0–15.0)
MCH: 31.9 pg (ref 26.0–34.0)
MCHC: 33.6 g/dL (ref 30.0–36.0)
MCV: 94.7 fL (ref 80.0–100.0)
Platelets: 220 10*3/uL (ref 150–400)
RBC: 3.61 MIL/uL — ABNORMAL LOW (ref 3.87–5.11)
RDW: 12.5 % (ref 11.5–15.5)
WBC: 14.4 10*3/uL — ABNORMAL HIGH (ref 4.0–10.5)
nRBC: 0 % (ref 0.0–0.2)

## 2020-01-12 NOTE — Progress Notes (Signed)
PPD #1 No problems, baby being evaluated for jaundice Afeb, VSS Fundus firm, NT at U-0 Continue routine postpartum care

## 2020-01-13 MED ORDER — DOCUSATE SODIUM 100 MG PO CAPS
100.0000 mg | ORAL_CAPSULE | Freq: Two times a day (BID) | ORAL | 1 refills | Status: DC | PRN
Start: 2020-01-13 — End: 2021-07-13

## 2020-01-13 MED ORDER — IBUPROFEN 800 MG PO TABS
800.0000 mg | ORAL_TABLET | Freq: Three times a day (TID) | ORAL | 1 refills | Status: DC | PRN
Start: 2020-01-13 — End: 2021-07-13

## 2020-01-13 NOTE — Discharge Summary (Signed)
Postpartum Discharge Summary  Date of Service updated     Patient Name: Jamie Jordan DOB: [birth year] MRN: [id number]  Date of admission: 01/11/2020 Delivery date:01/11/2020  Delivering provider: Paula Compton  Date of discharge: 01/13/2020  Admitting diagnosis: Normal labor [O80, Z37.9] NSVD (normal spontaneous vaginal delivery) [O80] Intrauterine pregnancy: [redacted]w[redacted]d    Secondary diagnosis:  Active Problems:   NSVD (normal spontaneous vaginal delivery)   Normal labor  Additional problems: NOne    Discharge diagnosis: Term Pregnancy Delivered                                              Post partum procedures:none Augmentation: AROM and Pitocin Complications: None  Hospital course: Onset of Labor With Vaginal Delivery      34y.o. yo [id number] admitted in Active Labor on 01/11/2020. Patient had an uncomplicated labor course as follows:  Membrane Rupture Time/Date: 9:52 AM ,01/11/2020   Delivery Method:Vaginal, Spontaneous  Episiotomy: None  Lacerations:  2nd degree  Patient had an uncomplicated postpartum course.  She is ambulating, tolerating a regular diet, passing flatus, and urinating well. Patient is discharged home in stable condition on 01/13/20.  Newborn Data: Birth date:01/11/2020  Birth time:11:35 AM  Gender:Female  Living status:Living  Apgars:8 ,9  Weight:2651 g   Magnesium Sulfate received: No BMZ received: No Rhophylac:No MMR:No T-DaP:Given prenatally Flu: Yes Transfusion:No  Physical exam  Vitals:   01/12/20 0545 01/12/20 1430 01/12/20 2035 01/13/20 0535  BP: 109/80 105/71 117/79 114/71  Pulse: 65 71 63 62  Resp: '17 16 18 19  ' Temp: 97.9 F (36.6 C) 98.1 F (36.7 C) 98.1 F (36.7 C) 98 F (36.7 C)  TempSrc: Oral Oral Oral Oral  SpO2: 100% 100% 100% 100%  Weight:      Height:       General: alert, cooperative and no distress Lochia: appropriate Uterine Fundus: firm Incision: N/A DVT Evaluation: No evidence of DVT seen on  physical exam. Negative Homan's sign. No cords or calf tenderness. Labs: Lab Results  Component Value Date   WBC 14.4 (H) 01/12/2020   HGB 11.5 (L) 01/12/2020   HCT 34.2 (L) 01/12/2020   MCV 94.7 01/12/2020   PLT 220 01/12/2020   CMP Latest Ref Rng & Units 10/24/2011  Glucose 70 - 99 mg/dL 78  BUN 6 - 23 mg/dL 11  Creatinine 0.40 - 1.20 mg/dL 0.6  Sodium 135 - 145 mEq/L 138  Potassium 3.5 - 5.1 mEq/L 4.0  Chloride 96 - 112 mEq/L 104  CO2 19 - 32 mEq/L 27  Calcium 8.4 - 10.5 mg/dL 9.2  Total Protein 6.0 - 8.3 g/dL 7.4  Total Bilirubin 0.3 - 1.2 mg/dL 0.4  Alkaline Phos 39 - 117 U/L 49  AST 0 - 37 U/L 19  ALT 0 - 35 U/L 13   Edinburgh Score: Edinburgh Postnatal Depression Scale Screening Tool 01/11/2020  I have been able to laugh and see the funny side of things. 0  I have looked forward with enjoyment to things. 0  I have blamed myself unnecessarily when things went wrong. 1  I have been anxious or worried for no good reason. 2  I have felt scared or panicky for no good reason. 0  Things have been getting on top of me. 1  I have been so unhappy that I have had  difficulty sleeping. 0  I have felt sad or miserable. 0  I have been so unhappy that I have been crying. 0  The thought of harming myself has occurred to me. 0  Edinburgh Postnatal Depression Scale Total 4      After visit meds:  Allergies as of 01/13/2020   No Known Allergies     Medication List    STOP taking these medications   amoxicillin-clavulanate 875-125 MG tablet Commonly known as: Augmentin   doxycycline 100 MG tablet Commonly known as: VIBRA-TABS   fluticasone 50 MCG/ACT nasal spray Commonly known as: FLONASE   promethazine-dextromethorphan 6.25-15 MG/5ML syrup Commonly known as: PROMETHAZINE-DM     TAKE these medications   docusate sodium 100 MG capsule Commonly known as: Colace Take 1 capsule (100 mg total) by mouth 2 (two) times daily as needed for mild constipation.   famotidine 10  MG tablet Commonly known as: PEPCID Take 10 mg by mouth daily as needed for heartburn or indigestion.   ibuprofen 800 MG tablet Commonly known as: ADVIL Take 1 tablet (800 mg total) by mouth every 8 (eight) hours as needed.   PRENATAL VITAMIN PO Take 1 tablet by mouth daily.        Discharge home in stable condition Infant Feeding: Bottle and Breast Infant Disposition:home with mother Discharge instruction: per After Visit Summary and Postpartum booklet. Activity: Advance as tolerated. Pelvic rest for 6 weeks.  Diet: routine diet Anticipated Birth Control: Unsure Postpartum Appointment:6 weeks Future Appointments:No future appointments. Follow up Visit:      01/13/2020 Deliah Boston, MD

## 2020-01-13 NOTE — Progress Notes (Signed)
POSTPARTUM PROGRESS NOTE  Post Partum Day #2  Subjective:  No acute events overnight.  Pt denies problems with ambulating, voiding or po intake.  She denies nausea or vomiting.  Pain is well controlled.   Lochia Minimal.  Declining circ  Objective: Blood pressure 114/71, pulse 62, temperature 98 F (36.7 C), temperature source Oral, resp. rate 19, height 5' 3.5" (1.613 m), weight 70.3 kg, last menstrual period 04/18/2019, SpO2 100 %, unknown if currently breastfeeding.  Physical Exam:  General: alert, cooperative and no distress Lochia:normal flow Chest: CTAB Heart: RRR no m/r/g Abdomen: +BS, soft, nontender Uterine Fundus: firm, 2cm below umbilicus Extremities: neg edema, neg calf TTP BL, neg Homans BL  Recent Labs    01/11/20 0743 01/12/20 0402  HGB 13.2 11.5*  HCT 39.7 34.2*    Assessment/Plan:  ASSESSMENT: Jamie Jordan is a 34 y.o. [id number] s/p SVD @ [redacted]w[redacted]d. PNC c/b 1st trim VB which resolved.   Discharge home, Breastfeeding and Contraception unsure    LOS: 2 days

## 2020-01-16 ENCOUNTER — Ambulatory Visit: Payer: Self-pay

## 2020-01-16 NOTE — Lactation Note (Signed)
This note was copied from a baby's chart. Lactation Consultation Note  Patient Name: Jamie Jordan Date: 01/16/2020 Reason for consult: Follow-up assessment;Initial assessment  1635 - 1640 - I conducted a discharge visit with Jamie Jordan. I reviewed our community breast feeding resources. She states that her son, Jamie Jordan, had a somewhat painful latch initially, but now it is more comfortable. Baby is now 34 days old, and her milk has transitioned.   She plans to follow up with [facility] on Monday. She has a 34 year old Medela pump at home. She was unaware that she could call her insurance to obtain a new pump; I encouraged her to do so as she plans to return to work in October, and she plans to pump.  No further questions at this time. Jamie Jordan is experienced with breast feeding. I encouraged her to join the support group or call us if there are any questions in the future. She verbalized understanding.   Maternal Data Does the patient have breastfeeding experience prior to this delivery?: Yes  Feeding Feeding Type: Breast Fed Nipple Type: Nfant Slow Flow (purple)  LATCH Score Latch: Grasps breast easily, tongue down, lips flanged, rhythmical sucking.  Audible Swallowing: Spontaneous and intermittent  Type of Nipple: Everted at rest and after stimulation  Comfort (Breast/Nipple): Soft / non-tender  Hold (Positioning): No assistance needed to correctly position infant at breast.  LATCH Score: 10  Interventions Interventions: Breast feeding basics reviewed  Lactation Tools Discussed/Used     Consult Status Consult Status: Complete    Walker Shadow 01/16/2020, 4:48 PM

## 2020-03-12 ENCOUNTER — Other Ambulatory Visit: Payer: BC Managed Care – PPO

## 2020-03-12 ENCOUNTER — Other Ambulatory Visit: Payer: Self-pay

## 2020-03-12 DIAGNOSIS — Z20822 Contact with and (suspected) exposure to covid-19: Secondary | ICD-10-CM

## 2020-03-15 LAB — SARS-COV-2, NAA 2 DAY TAT

## 2020-03-15 LAB — NOVEL CORONAVIRUS, NAA: SARS-CoV-2, NAA: NOT DETECTED

## 2020-05-30 ENCOUNTER — Ambulatory Visit: Payer: BC Managed Care – PPO | Attending: Internal Medicine

## 2020-05-30 DIAGNOSIS — Z23 Encounter for immunization: Secondary | ICD-10-CM

## 2020-05-30 NOTE — Progress Notes (Signed)
   Covid-19 Vaccination Clinic  Name:  Jamie Jordan    MRN: [id number] DOB: 1986-01-14  05/30/2020  [name] was observed post Covid-19 immunization for 15 minutes without incident. She was provided with Vaccine Information Sheet and instruction to access the V-Safe system.   [name] was instructed to call 911 with any severe reactions post vaccine: Marland Kitchen Difficulty breathing  . Swelling of face and throat  . A fast heartbeat  . A bad rash all over body  . Dizziness and weakness   Immunizations Administered    Name Date Dose VIS Date Route   Pfizer COVID-19 Vaccine 05/30/2020 11:22 AM 0.3 mL 05/05/2020 Intramuscular   Manufacturer: [employer]   Lot: [id number]   NDC: [address]-2

## 2021-05-31 DIAGNOSIS — Z332 Encounter for elective termination of pregnancy: Secondary | ICD-10-CM

## 2021-05-31 HISTORY — DX: Encounter for elective termination of pregnancy: Z33.2

## 2021-07-07 ENCOUNTER — Encounter (HOSPITAL_COMMUNITY): Payer: Self-pay | Admitting: Obstetrics and Gynecology

## 2021-07-07 ENCOUNTER — Other Ambulatory Visit: Payer: Self-pay

## 2021-07-07 ENCOUNTER — Inpatient Hospital Stay (HOSPITAL_COMMUNITY): Payer: BC Managed Care – PPO

## 2021-07-07 ENCOUNTER — Inpatient Hospital Stay (HOSPITAL_COMMUNITY)
Admission: AD | Admit: 2021-07-07 | Discharge: 2021-07-07 | Disposition: A | Discharge status: Home / Self Care | Payer: BC Managed Care – PPO | Attending: Obstetrics and Gynecology | Admitting: Obstetrics and Gynecology

## 2021-07-07 DIAGNOSIS — N939 Abnormal uterine and vaginal bleeding, unspecified: Secondary | ICD-10-CM | POA: Insufficient documentation

## 2021-07-07 DIAGNOSIS — O4690 Antepartum hemorrhage, unspecified, unspecified trimester: Secondary | ICD-10-CM

## 2021-07-07 DIAGNOSIS — O034 Incomplete spontaneous abortion without complication: Secondary | ICD-10-CM

## 2021-07-07 LAB — CBC WITH DIFFERENTIAL/PLATELET
Abs Immature Granulocytes: 0.01 10*3/uL (ref 0.00–0.07)
Basophils Absolute: 0 10*3/uL (ref 0.0–0.1)
Basophils Relative: 0 %
Eosinophils Absolute: 0.3 10*3/uL (ref 0.0–0.5)
Eosinophils Relative: 8 %
HCT: 37 % (ref 36.0–46.0)
Hemoglobin: 12.1 g/dL (ref 12.0–15.0)
Immature Granulocytes: 0 %
Lymphocytes Relative: 29 %
Lymphs Abs: 1.2 10*3/uL (ref 0.7–4.0)
MCH: 28.9 pg (ref 26.0–34.0)
MCHC: 32.7 g/dL (ref 30.0–36.0)
MCV: 88.3 fL (ref 80.0–100.0)
Monocytes Absolute: 0.4 10*3/uL (ref 0.1–1.0)
Monocytes Relative: 9 %
Neutro Abs: 2.2 10*3/uL (ref 1.7–7.7)
Neutrophils Relative %: 54 %
Platelets: 272 10*3/uL (ref 150–400)
RBC: 4.19 MIL/uL (ref 3.87–5.11)
RDW: 11.9 % (ref 11.5–15.5)
WBC: 4.1 10*3/uL (ref 4.0–10.5)
nRBC: 0 % (ref 0.0–0.2)

## 2021-07-07 LAB — POCT PREGNANCY, URINE: Preg Test, Ur: POSITIVE — AB

## 2021-07-07 LAB — HCG, QUANTITATIVE, PREGNANCY: hCG, Beta Chain, Quant, S: 3851 m[IU]/mL — ABNORMAL HIGH (ref <5)

## 2021-07-07 MED ORDER — MISOPROSTOL 200 MCG PO TABS
1000.0000 ug | ORAL_TABLET | Freq: Once | ORAL | Status: AC
  Administered 2021-07-07: 16:00:00 1000 ug via BUCCAL
  Filled 2021-07-07: qty 5

## 2021-07-07 NOTE — MAU Provider Note (Signed)
History     CSN: [id number]  Arrival date and time: 07/07/21 1257   Event Date/Time   First Provider Initiated Contact with Patient 07/07/21 1346      Chief Complaint  Patient presents with   Vaginal Bleeding   Jamie Jordan is a 35 y.o. [id number] who presents today with vaginal bleeding. She states that on 06/01/2021 she had a TAB at approx [redacted] weeks gestation. She had about 2-3 weeks of heavy bleeding afterward and now she has had about one pad per day of bleeding since. She had a +UPT at home as well. She denies any possibility of a new pregnancy at this time. She denies any pain.   Vaginal Bleeding The patient's primary symptoms include vaginal bleeding. This is a new problem. The current episode started more than 1 month ago. The problem occurs constantly. The problem has been gradually improving. The vaginal discharge was bloody. Vaginal bleeding amount: approx 1 pad per day. She has been passing clots. Nothing aggravates the symptoms.   OB History     Gravida  5   Para  2   Term  2   Preterm      AB  2   Living  2      SAB  1   IAB  1   Ectopic      Multiple  0   Live Births  2           Past Medical History:  Diagnosis Date   Headache    Medical history non-contributory     Past Surgical History:  Procedure Laterality Date   DILATION AND EVACUATION N/A 03/03/2015   Procedure: DILATATION AND EVACUATION;  Surgeon: Huel Cote, MD;  Location: [facility];  Service: Gynecology;  Laterality: N/A;   NO PAST SURGERIES      Family History  Problem Relation Age of Onset   Diabetes Maternal Grandmother     Social History   Tobacco Use   Smoking status: Never   Smokeless tobacco: Never  Vaping Use   Vaping Use: Never used  Substance Use Topics   Alcohol use: Not Currently    Comment: occ   Drug use: No    Allergies: No Known Allergies  Medications Prior to Admission  Medication Sig Dispense Refill Last Dose   docusate sodium (COLACE)  100 MG capsule Take 1 capsule (100 mg total) by mouth 2 (two) times daily as needed for mild constipation. 30 capsule 1    famotidine (PEPCID) 10 MG tablet Take 10 mg by mouth daily as needed for heartburn or indigestion.      ibuprofen (ADVIL) 800 MG tablet Take 1 tablet (800 mg total) by mouth every 8 (eight) hours as needed. 60 tablet 1    Prenatal Vit-Fe Fumarate-FA (PRENATAL VITAMIN PO) Take 1 tablet by mouth daily.       Review of Systems  Genitourinary:  Positive for vaginal bleeding.  All other systems reviewed and are negative. Physical Exam   Blood pressure 107/75, pulse 85, temperature 98.8 F (37.1 C), temperature source Oral, resp. rate 16, height 5\' 3"  (1.6 m), weight 60.8 kg, SpO2 100 %, unknown if currently breastfeeding.  Physical Exam Constitutional:      Appearance: She is well-developed.  HENT:     Head: Normocephalic.  Eyes:     Pupils: Pupils are equal, round, and reactive to light.  Cardiovascular:     Rate and Rhythm: Normal rate and regular rhythm.  Heart sounds: Normal heart sounds.  Pulmonary:     Effort: Pulmonary effort is normal. No respiratory distress.     Breath sounds: Normal breath sounds.  Abdominal:     Palpations: Abdomen is soft.     Tenderness: There is no abdominal tenderness.  Genitourinary:    Vagina: No bleeding. Vaginal discharge: mucusy.    Comments:      Musculoskeletal:        General: Normal range of motion.     Cervical back: Normal range of motion and neck supple.  Skin:    General: Skin is warm and dry.  Neurological:     Mental Status: She is alert and oriented to person, place, and time.  Psychiatric:        Mood and Affect: Mood normal.        Behavior: Behavior normal.    Results for orders placed or performed during the hospital encounter of 07/07/21 (from the past 24 hour(s))  Pregnancy, urine POC     Status: Abnormal   Collection Time: 07/07/21  1:13 PM  Result Value Ref Range   Preg Test, Ur POSITIVE  (A) NEGATIVE  CBC with Differential/Platelet     Status: None   Collection Time: 07/07/21  1:46 PM  Result Value Ref Range   WBC 4.1 4.0 - 10.5 K/uL   RBC 4.19 3.87 - 5.11 MIL/uL   Hemoglobin 12.1 12.0 - 15.0 g/dL   HCT 51.8 84.1 - 66.0 %   MCV 88.3 80.0 - 100.0 fL   MCH 28.9 26.0 - 34.0 pg   MCHC 32.7 30.0 - 36.0 g/dL   RDW 63.0 16.0 - 10.9 %   Platelets 272 150 - 400 K/uL   nRBC 0.0 0.0 - 0.2 %   Neutrophils Relative % 54 %   Neutro Abs 2.2 1.7 - 7.7 K/uL   Lymphocytes Relative 29 %   Lymphs Abs 1.2 0.7 - 4.0 K/uL   Monocytes Relative 9 %   Monocytes Absolute 0.4 0.1 - 1.0 K/uL   Eosinophils Relative 8 %   Eosinophils Absolute 0.3 0.0 - 0.5 K/uL   Basophils Relative 0 %   Basophils Absolute 0.0 0.0 - 0.1 K/uL   Immature Granulocytes 0 %   Abs Immature Granulocytes 0.01 0.00 - 0.07 K/uL  hCG, quantitative, pregnancy     Status: Abnormal   Collection Time: 07/07/21  1:46 PM  Result Value Ref Range   hCG, Beta Chain, Quant, S 3,851 (H) <5 mIU/mL   US OB LESS THAN 14 WEEKS WITH OB TRANSVAGINAL  Result Date: 07/07/2021 CLINICAL DATA:  Vaginal bleeding in first trimester of pregnancy, post therapeutic abortion on 06/01/2021 unknown LMP EXAM: OBSTETRIC <14 WK [place] AND TRANSVAGINAL OB US TECHNIQUE: Both transabdominal and transvaginal ultrasound examinations were performed for complete evaluation of the gestation as well as the maternal uterus, adnexal regions, and pelvic cul-de-sac. Transvaginal technique was performed to assess early pregnancy. COMPARISON:  None FINDINGS: Intrauterine gestational sac: Absent Yolk sac:  N/A Embryo:  N/A Cardiac Activity: N/A Heart Rate: N/A  bpm MSD:   mm    w     d CRL:    mm    w    d                  [place] EDC: Subchorionic hemorrhage:  N/A Maternal uterus/adnexae: Uterus anteverted without focal mass. Endometrial complex is distended by heterogeneous material question blood and/or products of conception measuring 18 x 25 x  28 mm with associated trace  endometrial fluid. RIGHT ovary normal size and morphology, 2.1 x 1.1 x 1.2 cm. LEFT ovary measures 2.9 x 1.9 x 2.0 cm and contains a small corpus luteal cyst. No free pelvic fluid or adnexal masses. IMPRESSION: No intrauterine gestational sac identified. Correlation quantitative beta HCG recommended to determine if patient is pregnant. If patient is pregnant, findings would represent pregnancy of unknown anatomic location; differential diagnosis would include ectopic pregnancy, spontaneous abortion, and intrauterine pregnancy too early to visualize, or less likely residual beta HCG from prior therapeutic abortion. Abnormal complex heterogeneous material distends upper endometrial canal, 18 x 25 x 28 mm in size; in a patient recently post therapeutic abortion, this could represent blood and or retained products of conception. Electronically Signed   By: Ulyses Southward M.D.   On: 07/07/2021 15:03     MAU Course  Procedures  MDM 1600: DW Dr. Jolayne Panther, will give patient cytotec today and patient to FU on Tuesday 08/12/2020 for repeat HCG and provider visit to check on bleeding.   Clinic was only able to get her in for appt on 07/13/2021.  Assessment and Plan   1. Retained products of conception following abortion   2. Vaginal bleeding in pregnancy    DC home Comfort measures reviewed  Bleeding precautions RX: none, treated with cytotec here in MAU  Return to MAU as needed FU with OB as planned   Follow-up Information     [facility] Follow up.   Specialty: Obstetrics and Gynecology Why: Wednesday 07/13/2021 at 1:15PM Contact information: [address] [place] [address] [phone]                Thressa Sheller DNP, CNM  07/07/21  4:16 PM

## 2021-07-07 NOTE — MAU Note (Signed)
Jamie Jordan is a 35 y.o. here in MAU reporting: had TAB on Nov 16. Went for follow up but states she got her dates mixed up, and had not been seen. Is still having some bleeding and having a + UPT. Is concerned she may still have something left from the pregnancy.   LMP: unknown  Onset of complaint: ongoing  Pain score: 0/10  Vitals:   07/07/21 1317  BP: 122/88  Pulse: 98  Resp: 16  Temp: 98.8 F (37.1 C)  SpO2: 100%     Lab orders placed from triage: upt

## 2021-07-13 ENCOUNTER — Ambulatory Visit (INDEPENDENT_AMBULATORY_CARE_PROVIDER_SITE_OTHER): Payer: BC Managed Care – PPO | Admitting: Family Medicine

## 2021-07-13 ENCOUNTER — Encounter: Payer: Self-pay | Admitting: Family Medicine

## 2021-07-13 ENCOUNTER — Other Ambulatory Visit: Payer: Self-pay

## 2021-07-13 VITALS — BP 111/74 | HR 78 | Ht 63.5 in | Wt 135.1 lb

## 2021-07-13 DIAGNOSIS — O021 Missed abortion: Secondary | ICD-10-CM | POA: Diagnosis not present

## 2021-07-13 NOTE — Progress Notes (Signed)
GYNECOLOGY OFFICE VISIT NOTE  History:   Jamie Jordan is a 35 y.o. [id number] here today for MAU follow up.  Seen in MAU on 07/07/2021 after TAB on 06/01/2021 with increased bleeding Diagnosed with retained POC, given misoprostol 1000 mcg cytotec and set up for follow up visit to check hcg  Today reports she had a large amount of bleeding after taking cytotec and believes she passed POC Since that time bleeding has slowed significantly with exception of yesterday when it was a little heavier than it has been No abdominal pain No fevers Interested in birth control Normally goes to [facility], has follow up on 08/01/2021  Health Maintenance Due  Topic Date Due   Hepatitis C Screening  Never done   PAP SMEAR-Modifier  09/04/2016    Past Medical History:  Diagnosis Date   Headache    Medical history non-contributory    Termination of pregnancy (fetus) 05/31/2021    Past Surgical History:  Procedure Laterality Date   DILATION AND EVACUATION N/A 03/03/2015   Procedure: DILATATION AND EVACUATION;  Surgeon: Huel Cote, MD;  Location: [facility];  Service: Gynecology;  Laterality: N/A;   NO PAST SURGERIES      The following portions of the patient's history were reviewed and updated as appropriate: allergies, current medications, past family history, past medical history, past social history, past surgical history and problem list.   Health Maintenance:   Last pap: No results found for: DIAGPAP, HPV, HPVHIGH   Last mammogram:  N/a    Review of Systems:  Pertinent items noted in HPI and remainder of comprehensive ROS otherwise negative.  Physical Exam:  BP 111/74    Pulse 78    Ht 5' 3.5" (1.613 m)    Wt 135 lb 1.6 oz (61.3 kg)    LMP  (LMP Unknown)    Breastfeeding Unknown    BMI 23.56 kg/m  CONSTITUTIONAL: Well-developed, well-nourished female in no acute distress.  HEENT:  Normocephalic, atraumatic. External right and left ear normal. No scleral icterus.   NECK: Normal range of motion, supple, no masses noted on observation SKIN: No rash noted. Not diaphoretic. No erythema. No pallor. MUSCULOSKELETAL: Normal range of motion. No edema noted. NEUROLOGIC: Alert and oriented to person, place, and time. Normal muscle tone coordination.  PSYCHIATRIC: Normal mood and affect. Normal behavior. Normal judgment and thought content. RESPIRATORY: Effort normal, no problems with respiration noted   Labs and Imaging Results for orders placed or performed during the hospital encounter of 07/07/21 (from the past 168 hour(s))  Pregnancy, urine POC   Collection Time: 07/07/21  1:13 PM  Result Value Ref Range   Preg Test, Ur POSITIVE (A) NEGATIVE  CBC with Differential/Platelet   Collection Time: 07/07/21  1:46 PM  Result Value Ref Range   WBC 4.1 4.0 - 10.5 K/uL   RBC 4.19 3.87 - 5.11 MIL/uL   Hemoglobin 12.1 12.0 - 15.0 g/dL   HCT 95.1 88.4 - 16.6 %   MCV 88.3 80.0 - 100.0 fL   MCH 28.9 26.0 - 34.0 pg   MCHC 32.7 30.0 - 36.0 g/dL   RDW 06.3 01.6 - 01.0 %   Platelets 272 150 - 400 K/uL   nRBC 0.0 0.0 - 0.2 %   Neutrophils Relative % 54 %   Neutro Abs 2.2 1.7 - 7.7 K/uL   Lymphocytes Relative 29 %   Lymphs Abs 1.2 0.7 - 4.0 K/uL   Monocytes Relative 9 %   Monocytes Absolute 0.4  0.1 - 1.0 K/uL   Eosinophils Relative 8 %   Eosinophils Absolute 0.3 0.0 - 0.5 K/uL   Basophils Relative 0 %   Basophils Absolute 0.0 0.0 - 0.1 K/uL   Immature Granulocytes 0 %   Abs Immature Granulocytes 0.01 0.00 - 0.07 K/uL  hCG, quantitative, pregnancy   Collection Time: 07/07/21  1:46 PM  Result Value Ref Range   hCG, Beta Chain, Quant, S 3,851 (H) <5 mIU/mL   US OB LESS THAN 14 WEEKS WITH OB TRANSVAGINAL  Result Date: 07/07/2021 CLINICAL DATA:  Vaginal bleeding in first trimester of pregnancy, post therapeutic abortion on 06/01/2021 unknown LMP EXAM: OBSTETRIC <14 WK [place] AND TRANSVAGINAL OB US TECHNIQUE: Both transabdominal and transvaginal ultrasound  examinations were performed for complete evaluation of the gestation as well as the maternal uterus, adnexal regions, and pelvic cul-de-sac. Transvaginal technique was performed to assess early pregnancy. COMPARISON:  None FINDINGS: Intrauterine gestational sac: Absent Yolk sac:  N/A Embryo:  N/A Cardiac Activity: N/A Heart Rate: N/A  bpm MSD:   mm    w     d CRL:    mm    w    d                  [place] EDC: Subchorionic hemorrhage:  N/A Maternal uterus/adnexae: Uterus anteverted without focal mass. Endometrial complex is distended by heterogeneous material question blood and/or products of conception measuring 18 x 25 x 28 mm with associated trace endometrial fluid. RIGHT ovary normal size and morphology, 2.1 x 1.1 x 1.2 cm. LEFT ovary measures 2.9 x 1.9 x 2.0 cm and contains a small corpus luteal cyst. No free pelvic fluid or adnexal masses. IMPRESSION: No intrauterine gestational sac identified. Correlation quantitative beta HCG recommended to determine if patient is pregnant. If patient is pregnant, findings would represent pregnancy of unknown anatomic location; differential diagnosis would include ectopic pregnancy, spontaneous abortion, and intrauterine pregnancy too early to visualize, or less likely residual beta HCG from prior therapeutic abortion. Abnormal complex heterogeneous material distends upper endometrial canal, 18 x 25 x 28 mm in size; in a patient recently post therapeutic abortion, this could represent blood and or retained products of conception. Electronically Signed   By: Ulyses Southward M.D.   On: 07/07/2021 15:03      Assessment and Plan:   Problem List Items Addressed This Visit       Other   Retained products of conception, early pregnancy - Primary    Patient doing well s/p cytotec for retained POC. Given no confirmation of IUP in our system will obtain hcg to ensure downtrending. Discussed birth control options at length with focus on LARCs, she will consider information and follow up  with Dr. Senaida Ores at [facility] in three weeks. Reviewed warning signs that would prompt MAU visit.       Relevant Orders   Beta hCG quant (ref lab)    Routine preventative health maintenance measures emphasized. Please refer to After Visit Summary for other counseling recommendations.   Return if symptoms worsen or fail to improve.    Total face-to-face time with patient: 20 minutes.  Over 50% of encounter was spent on counseling and coordination of care.   Venora Maples, MD/MPH Attending Family Medicine Physician, [facility] for [facility], [facility]

## 2021-07-13 NOTE — Progress Notes (Signed)
Pt states took 5 cytotec tablets on 07/07/2021. Pt states had bleeding and passed POC. Last night started bleeding again but lighter. Changing pad only once a day.  Judeth Cornfield, RN

## 2021-07-13 NOTE — Assessment & Plan Note (Signed)
Patient doing well s/p cytotec for retained POC. Given no confirmation of IUP in our system will obtain hcg to ensure downtrending. Discussed birth control options at length with focus on LARCs, she will consider information and follow up with Dr. Senaida Ores at [facility] in three weeks. Reviewed warning signs that would prompt MAU visit.

## 2021-07-14 LAB — BETA HCG QUANT (REF LAB): hCG Quant: 454 m[IU]/mL

## 2021-11-04 ENCOUNTER — Other Ambulatory Visit (HOSPITAL_BASED_OUTPATIENT_CLINIC_OR_DEPARTMENT_OTHER): Payer: Self-pay

## 2021-11-04 ENCOUNTER — Ambulatory Visit: Payer: BC Managed Care – PPO | Attending: Internal Medicine

## 2021-11-04 DIAGNOSIS — Z23 Encounter for immunization: Secondary | ICD-10-CM

## 2021-11-04 MED ORDER — PFIZER COVID-19 VAC BIVALENT 30 MCG/0.3ML IM SUSP
INTRAMUSCULAR | 0 refills | Status: DC
  Filled 2021-11-04: qty 0.3, 1d supply, fill #0

## 2021-11-07 NOTE — Progress Notes (Signed)
? ?  Covid-19 Vaccination Clinic ? ?Name:  Jamie Jordan    ?MRN: [id number] ?DOB: [birth year] ? ?11/07/2021 ? ?Ms. Sidney was observed post Covid-19 immunization for 15 minutes without incident. She was provided with Vaccine Information Sheet and instruction to access the V-Safe system.  ? ?Ms. Chiquito was instructed to call 911 with any severe reactions post vaccine: ?Difficulty breathing  ?Swelling of face and throat  ?A fast heartbeat  ?A bad rash all over body  ?Dizziness and weakness  ? ?Immunizations Administered   ? ? Name Date Dose VIS Date Route  ? Art gallery manager Booster 11/04/2021  4:08 PM 0.3 mL 03/16/2021 Intramuscular  ? Manufacturer: Pfizer, Inc  ? Lot: [id number]  ? NDC: [phone]  ? ?  ? ? ?

## 2022-06-12 IMAGING — US US OB < 14 WEEKS - US OB TV
1 series · 15 of 28 positions shown · non-contrast
Comparison: None

CLINICAL DATA: Vaginal bleeding in first trimester of pregnancy,
post therapeutic abortion on 06/01/2021 unknown LMP

EXAM:
OBSTETRIC <14 WK US AND TRANSVAGINAL OB US
TECHNIQUE: Both transabdominal and transvaginal ultrasound examinations were
performed for complete evaluation of the gestation as well as the
maternal uterus, adnexal regions, and pelvic cul-de-sac.
Transvaginal technique was performed to assess early pregnancy.

[Series 1: us ob < 14 weeks - us ob tv · 15 of 88 slices shown]
[im 1/88]
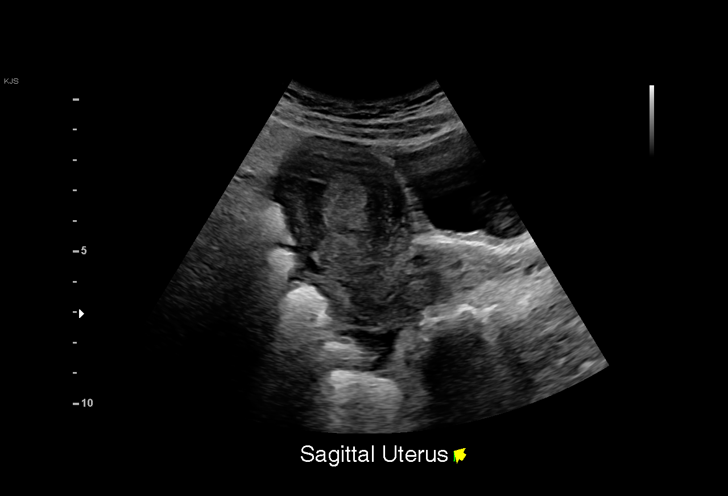
[im 7/88]
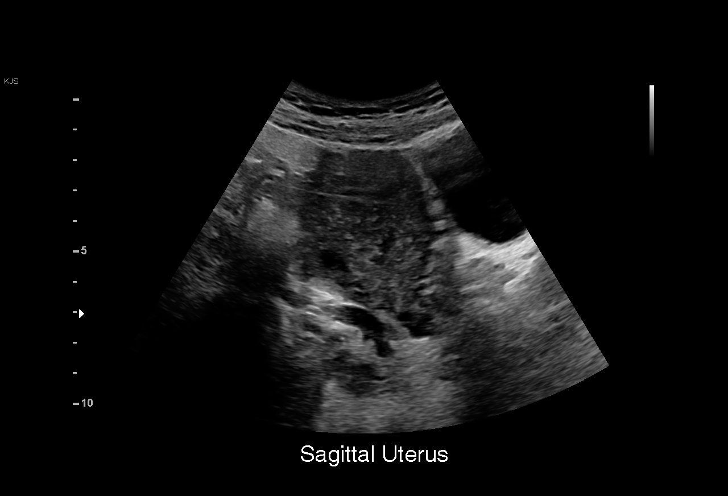
[im 13/88]
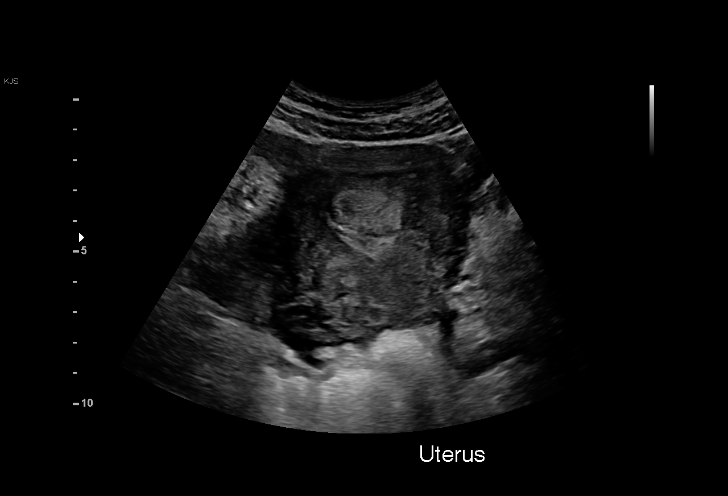
[im 20/88]
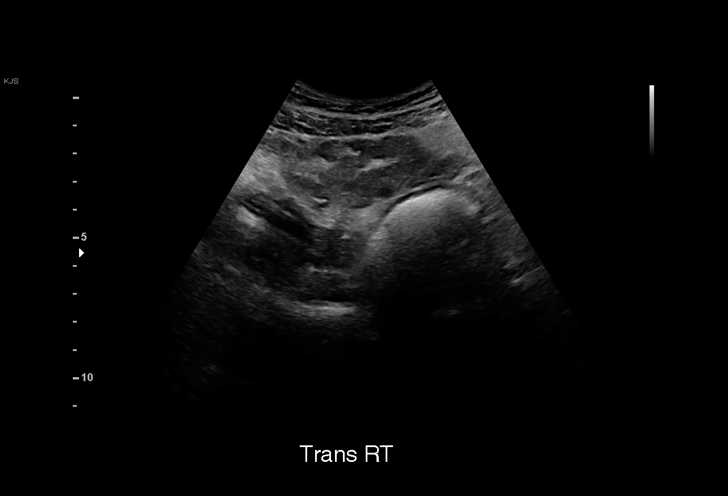
[im 26/88]
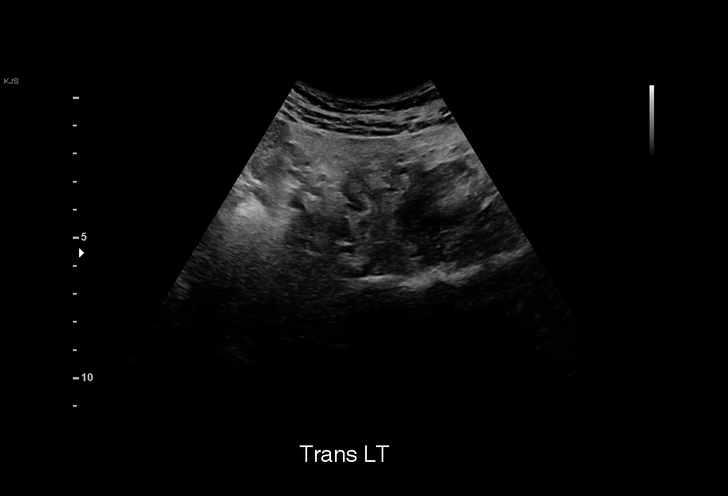
[im 33/88]
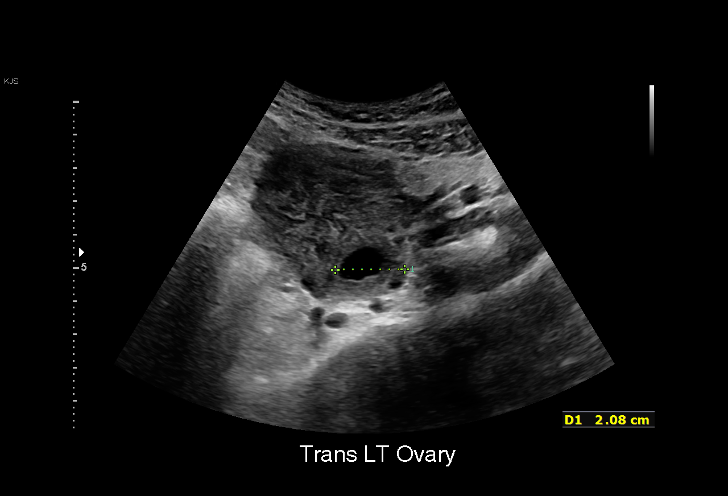
[im 39/88]
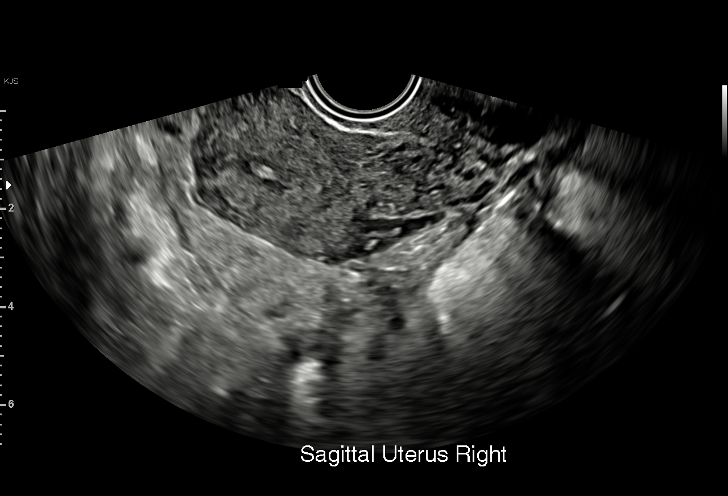
[im 46/88]
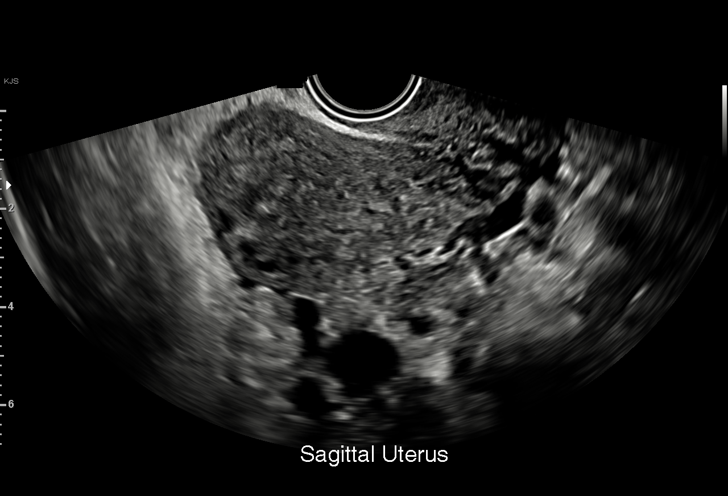
[im 49/88]
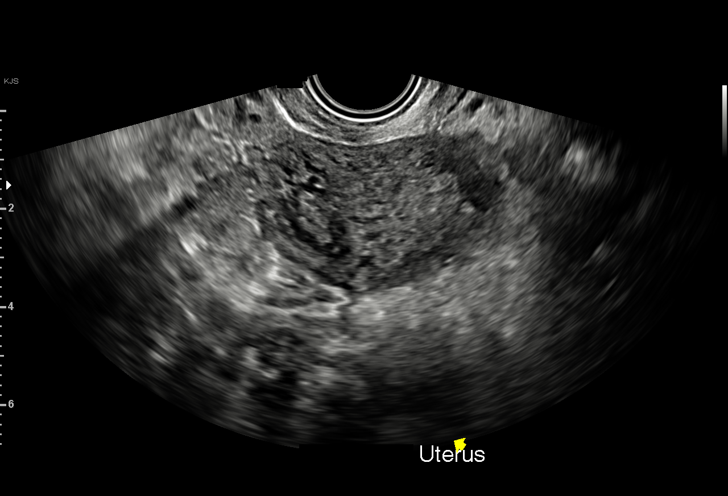
[im 55/88]
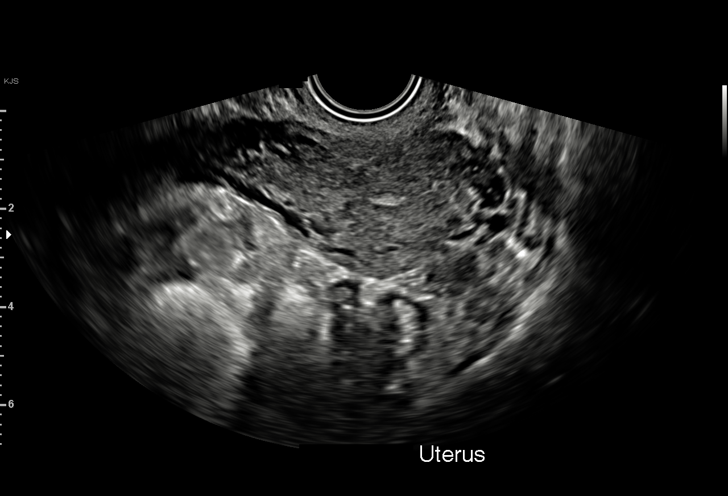
[im 62/88]
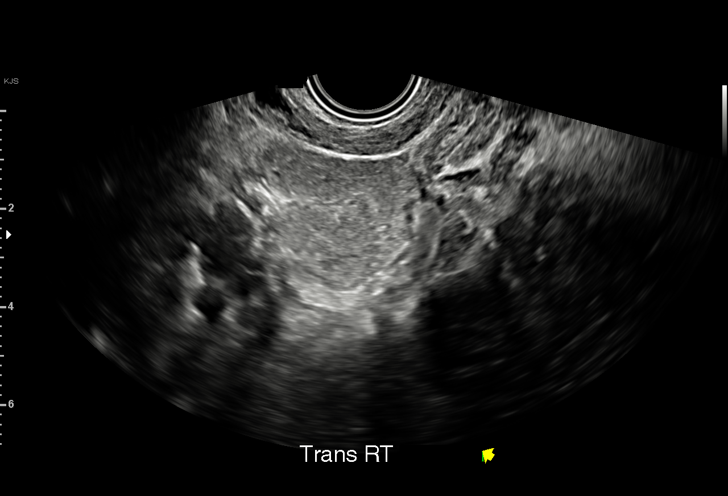
[im 68/88]
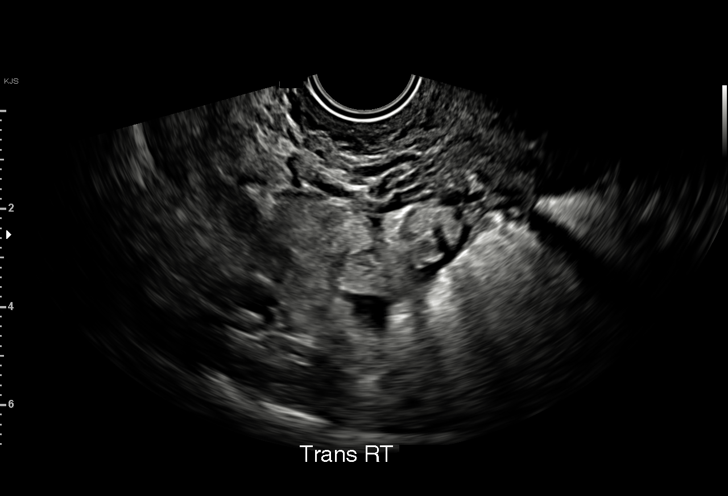
[im 75/88]
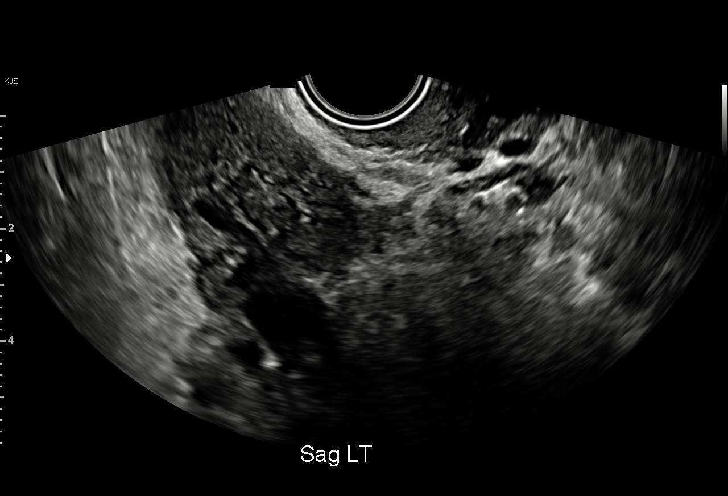
[im 81/88]
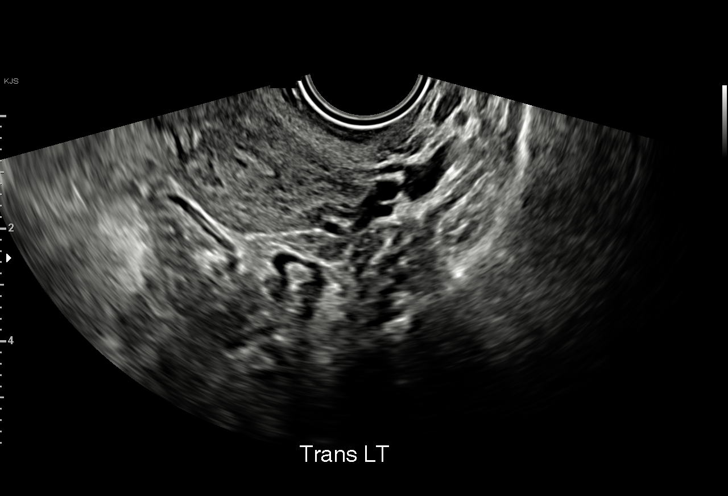
[im 88/88]
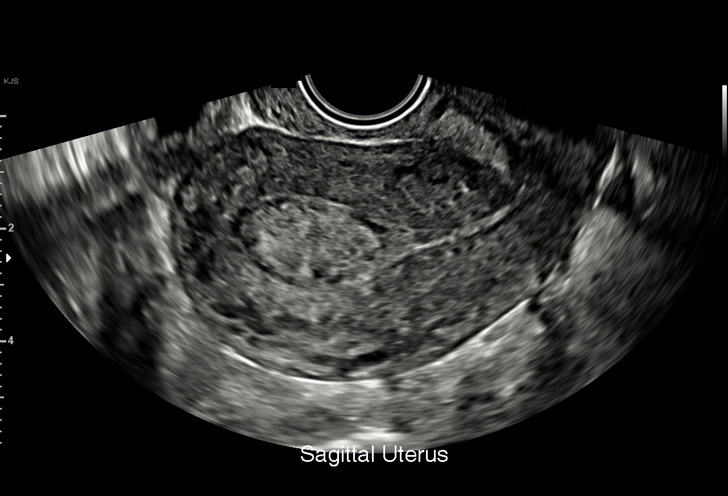

[15 of 28 positions shown; findings below may reference images not displayed]

FINDINGS: Intrauterine gestational sac: Absent

Yolk sac:  N/A

Embryo:  N/A

Cardiac Activity: N/A

Heart Rate: N/A  bpm

MSD:   mm    w     d

CRL:    mm    w    d                  US EDC:

Subchorionic hemorrhage:  N/A

Maternal uterus/adnexae:

Uterus anteverted without focal mass.

Endometrial complex is distended by heterogeneous material question
blood and/or products of conception measuring 18 x 25 x 28 mm with
associated trace endometrial fluid.

RIGHT ovary normal size and morphology, 2.1 x 1.1 x 1.2 cm.

LEFT ovary measures 2.9 x 1.9 x 2.0 cm and contains a small corpus
luteal cyst.

No free pelvic fluid or adnexal masses.
IMPRESSION: No intrauterine gestational sac identified.

Correlation quantitative beta HCG recommended to determine if
patient is pregnant.

If patient is pregnant, findings would represent pregnancy of
unknown anatomic location; differential diagnosis would include
ectopic pregnancy, spontaneous abortion, and intrauterine pregnancy
too early to visualize, or less likely residual beta HCG from prior
therapeutic abortion.

Abnormal complex heterogeneous material distends upper endometrial
canal, 18 x 25 x 28 mm in size; in a patient recently post
therapeutic abortion, this could represent blood and or retained
products of conception.

## 2022-11-02 ENCOUNTER — Encounter (HOSPITAL_COMMUNITY): Payer: Self-pay | Admitting: Obstetrics and Gynecology

## 2022-11-02 ENCOUNTER — Other Ambulatory Visit: Payer: Self-pay

## 2022-11-02 NOTE — H&P (Signed)
Jamie Jordan is an 37 y.o. female. Presenting for scheduled procedure  Pertinent Gynecological History: Menses:  irregular with some amount of daily bleeding since 3/28 Bleeding: dysfunctional uterine bleeding Contraception: condoms DES exposure: denies Blood transfusions: none Sexually transmitted diseases: no past history Previous GYN Procedures: DNC  Last pap: normal Date: 08/01/21 /OB History: G6, P2042   Menstrual History: Menarche age: 54 Patient's last menstrual period was 10/12/2022.    Past Medical History:  Diagnosis Date   Headache    Medical history non-contributory    Termination of pregnancy (fetus) 05/31/2021    Past Surgical History:  Procedure Laterality Date   DILATION AND EVACUATION N/A 03/03/2015   Procedure: DILATATION AND EVACUATION;  Surgeon: Huel Cote, MD;  Location: [facility];  Service: Gynecology;  Laterality: N/A;   NO PAST SURGERIES      Family History  Problem Relation Age of Onset   Diabetes Maternal Grandmother     Social History:  reports that she has never smoked. She has never been exposed to tobacco smoke. She has never used smokeless tobacco. She reports that she does not currently use alcohol. She reports that she does not use drugs.  Allergies: No Known Allergies  No medications prior to admission.    Review of Systems  Constitutional:  Negative for chills and fever.  Respiratory:  Negative for shortness of breath.   Cardiovascular:  Negative for chest pain, palpitations and leg swelling.  Gastrointestinal:  Negative for abdominal pain, nausea and vomiting.  Genitourinary:  Positive for vaginal bleeding.  Neurological:  Negative for dizziness, weakness and headaches.  Psychiatric/Behavioral:  Negative for suicidal ideas.     Last menstrual period 10/12/2022, unknown if currently breastfeeding. Physical Exam Gen: NAD CV: CTAB, RRR Abd: soft, NTTP, Bsx4 GU deferred Psych/Neuro WNL  No results found for this or any  previous visit (from the past 24 hour(s)). TVUS 4/16: right and left ovaries WNL, endometrial thickness 17mm, irregular borders, heterogenous polypiod mass/lesions x 2 with multiple hypervascular stalks.  No results found.  Assessment/Plan: [id number] [id number] on condoms presents for preop visit for surgical management of AUB-IMB plus Mirena IUD placement -Counseled on hysteroscopy D&C  The patient was informed of the risks and benefits of a hysteroscopy with dilation and curettage. Risks included but were not limited to bleeidng, infections, injury to the vulva, vagina or cerivx, or uterine perforation. If concern for latter, may proceed with diagnostic laparoscopy for evaluation of injury which may require surgical repair. Patient understands and is amenable. Plan to place Mirena IUD as well for both contraceptive benefit as well as suppression of menses.   Jamie Jordan 11/02/2022, 10:21 PM

## 2022-11-02 NOTE — Progress Notes (Signed)
Spoke with pt for pre-op call. Pt denies cardiac history, HTN or Diabetes.   Shower instructions given to pt.  Left pre-op instructions on pt's voicemail. She had a meeting she had to attend.

## 2022-11-02 NOTE — Anesthesia Preprocedure Evaluation (Addendum)
Anesthesia Evaluation  Patient identified by MRN, date of birth, ID band Patient awake    Reviewed: Allergy & Precautions, NPO status , Patient's Chart, lab work & pertinent test results  History of Anesthesia Complications Negative for: history of anesthetic complications  Airway Mallampati: I  TM Distance: >3 FB Neck ROM: Full    Dental  (+) Dental Advisory Given, Teeth Intact   Pulmonary neg pulmonary ROS   Pulmonary exam normal breath sounds clear to auscultation       Cardiovascular negative cardio ROS Normal cardiovascular exam Rhythm:Regular Rate:Normal     Neuro/Psych  Headaches  negative psych ROS   GI/Hepatic negative GI ROS, Neg liver ROS,,,  Endo/Other  negative endocrine ROS    Renal/GU negative Renal ROS     Musculoskeletal negative musculoskeletal ROS (+)    Abdominal   Peds  Hematology negative hematology ROS (+)   Anesthesia Other Findings   Reproductive/Obstetrics negative OB ROS                             Anesthesia Physical Anesthesia Plan  ASA: 2  Anesthesia Plan: General   Post-op Pain Management: Tylenol PO (pre-op)* and Toradol IV (intra-op)*   Induction: Intravenous  PONV Risk Score and Plan: Ondansetron, Dexamethasone, Midazolam, Scopolamine patch - Pre-op and Treatment may vary due to age or medical condition  Airway Management Planned: LMA  Additional Equipment:   Intra-op Plan:   Post-operative Plan: Extubation in OR  Informed Consent: I have reviewed the patients History and Physical, chart, labs and discussed the procedure including the risks, benefits and alternatives for the proposed anesthesia with the patient or authorized representative who has indicated his/her understanding and acceptance.     Dental advisory given  Plan Discussed with: CRNA  Anesthesia Plan Comments:         Anesthesia Quick Evaluation

## 2022-11-03 ENCOUNTER — Encounter (HOSPITAL_COMMUNITY)
Admission: RE | Disposition: A | Discharge status: Home / Self Care | Payer: Self-pay | Source: Home / Self Care | Attending: Obstetrics and Gynecology

## 2022-11-03 ENCOUNTER — Ambulatory Visit (HOSPITAL_COMMUNITY): Payer: BC Managed Care – PPO | Admitting: Anesthesiology

## 2022-11-03 ENCOUNTER — Other Ambulatory Visit: Payer: Self-pay

## 2022-11-03 ENCOUNTER — Ambulatory Visit (HOSPITAL_COMMUNITY)
Admission: RE | Admit: 2022-11-03 | Discharge: 2022-11-03 | Disposition: A | Discharge status: Home / Self Care | Payer: BC Managed Care – PPO | Attending: Obstetrics and Gynecology | Admitting: Obstetrics and Gynecology

## 2022-11-03 DIAGNOSIS — Z30430 Encounter for insertion of intrauterine contraceptive device: Secondary | ICD-10-CM | POA: Diagnosis not present

## 2022-11-03 DIAGNOSIS — N939 Abnormal uterine and vaginal bleeding, unspecified: Secondary | ICD-10-CM | POA: Insufficient documentation

## 2022-11-03 DIAGNOSIS — Z01818 Encounter for other preprocedural examination: Secondary | ICD-10-CM

## 2022-11-03 HISTORY — PX: INTRAUTERINE DEVICE (IUD) INSERTION: SHX5877

## 2022-11-03 HISTORY — PX: HYSTEROSCOPY: SHX211

## 2022-11-03 HISTORY — PX: DILATATION & CURETTAGE/HYSTEROSCOPY WITH MYOSURE: SHX6511

## 2022-11-03 LAB — POCT PREGNANCY, URINE: Preg Test, Ur: NEGATIVE

## 2022-11-03 LAB — TYPE AND SCREEN
ABO/RH(D): O POS
Antibody Screen: NEGATIVE

## 2022-11-03 LAB — CBC
HCT: 34.5 % — ABNORMAL LOW (ref 36.0–46.0)
Hemoglobin: 10.9 g/dL — ABNORMAL LOW (ref 12.0–15.0)
MCH: 28.2 pg (ref 26.0–34.0)
MCHC: 31.6 g/dL (ref 30.0–36.0)
MCV: 89.1 fL (ref 80.0–100.0)
Platelets: 363 10*3/uL (ref 150–400)
RBC: 3.87 MIL/uL (ref 3.87–5.11)
RDW: 12 % (ref 11.5–15.5)
WBC: 6.3 10*3/uL (ref 4.0–10.5)
nRBC: 0 % (ref 0.0–0.2)

## 2022-11-03 SURGERY — DILATATION & CURETTAGE/HYSTEROSCOPY WITH MYOSURE
Anesthesia: General | Site: Vagina

## 2022-11-03 MED ORDER — EPHEDRINE SULFATE-NACL 50-0.9 MG/10ML-% IV SOSY
PREFILLED_SYRINGE | INTRAVENOUS | Status: DC | PRN
  Administered 2022-11-03 (×5): 5 mg via INTRAVENOUS

## 2022-11-03 MED ORDER — SILVER NITRATE-POT NITRATE 75-25 % EX MISC
CUTANEOUS | Status: AC
  Filled 2022-11-03: qty 10

## 2022-11-03 MED ORDER — MIDAZOLAM HCL 2 MG/2ML IJ SOLN
INTRAMUSCULAR | Status: AC
  Filled 2022-11-03: qty 2

## 2022-11-03 MED ORDER — LIDOCAINE 2% (20 MG/ML) 5 ML SYRINGE
INTRAMUSCULAR | Status: AC
  Filled 2022-11-03: qty 5

## 2022-11-03 MED ORDER — LEVONORGESTREL 20 MCG/DAY IU IUD
1.0000 | INTRAUTERINE_SYSTEM | INTRAUTERINE | Status: AC
  Administered 2022-11-03: 1 via INTRAUTERINE
  Filled 2022-11-03 (×2): qty 1

## 2022-11-03 MED ORDER — DEXAMETHASONE SODIUM PHOSPHATE 10 MG/ML IJ SOLN
INTRAMUSCULAR | Status: AC
  Filled 2022-11-03: qty 1

## 2022-11-03 MED ORDER — DEXAMETHASONE SODIUM PHOSPHATE 4 MG/ML IJ SOLN
INTRAMUSCULAR | Status: DC | PRN
  Administered 2022-11-03: 5 mg via INTRAVENOUS

## 2022-11-03 MED ORDER — OXYCODONE HCL 5 MG/5ML PO SOLN: 5.0000 mg | Freq: Once | ORAL | Status: DC | PRN

## 2022-11-03 MED ORDER — POVIDONE-IODINE 10 % EX SWAB
2.0000 | Freq: Once | CUTANEOUS | Status: AC
  Administered 2022-11-03: 2 via TOPICAL

## 2022-11-03 MED ORDER — ONDANSETRON HCL 4 MG/2ML IJ SOLN
INTRAMUSCULAR | Status: DC | PRN
  Administered 2022-11-03: 4 mg via INTRAVENOUS

## 2022-11-03 MED ORDER — MIDAZOLAM HCL 5 MG/5ML IJ SOLN
INTRAMUSCULAR | Status: DC | PRN
  Administered 2022-11-03: 2 mg via INTRAVENOUS

## 2022-11-03 MED ORDER — SODIUM CHLORIDE 0.9 % IR SOLN
Status: DC | PRN
  Administered 2022-11-03: 1000 mL

## 2022-11-03 MED ORDER — AMISULPRIDE (ANTIEMETIC) 5 MG/2ML IV SOLN: 10.0000 mg | Freq: Once | INTRAVENOUS | Status: DC | PRN

## 2022-11-03 MED ORDER — LACTATED RINGERS IV SOLN: INTRAVENOUS | Status: DC

## 2022-11-03 MED ORDER — OXYCODONE HCL 5 MG PO TABS: 5.0000 mg | ORAL_TABLET | Freq: Once | ORAL | Status: DC | PRN

## 2022-11-03 MED ORDER — PROMETHAZINE HCL 25 MG/ML IJ SOLN: 6.2500 mg | INTRAMUSCULAR | Status: DC | PRN

## 2022-11-03 MED ORDER — KETOROLAC TROMETHAMINE 30 MG/ML IJ SOLN
INTRAMUSCULAR | Status: DC | PRN
  Administered 2022-11-03: 30 mg via INTRAVENOUS

## 2022-11-03 MED ORDER — IBUPROFEN 800 MG PO TABS: 800.0000 mg | ORAL_TABLET | Freq: Three times a day (TID) | ORAL | 0 refills | Status: AC | PRN

## 2022-11-03 MED ORDER — PROPOFOL 10 MG/ML IV BOLUS
INTRAVENOUS | Status: AC
  Filled 2022-11-03: qty 20

## 2022-11-03 MED ORDER — PROPOFOL 10 MG/ML IV BOLUS
INTRAVENOUS | Status: DC | PRN
  Administered 2022-11-03: 150 mg via INTRAVENOUS

## 2022-11-03 MED ORDER — SILVER NITRATE-POT NITRATE 75-25 % EX MISC
CUTANEOUS | Status: DC | PRN
  Administered 2022-11-03: 4

## 2022-11-03 MED ORDER — ACETAMINOPHEN 500 MG PO TABS
1000.0000 mg | ORAL_TABLET | Freq: Once | ORAL | Status: AC
  Administered 2022-11-03: 1000 mg via ORAL
  Filled 2022-11-03: qty 2

## 2022-11-03 MED ORDER — LIDOCAINE HCL (CARDIAC) PF 100 MG/5ML IV SOSY
PREFILLED_SYRINGE | INTRAVENOUS | Status: DC | PRN
  Administered 2022-11-03: 60 mg via INTRAVENOUS

## 2022-11-03 MED ORDER — ORAL CARE MOUTH RINSE: 15.0000 mL | Freq: Once | OROMUCOSAL | Status: AC

## 2022-11-03 MED ORDER — SCOPOLAMINE 1 MG/3DAYS TD PT72
1.0000 | MEDICATED_PATCH | TRANSDERMAL | Status: DC
  Administered 2022-11-03: 1.5 mg via TRANSDERMAL
  Filled 2022-11-03: qty 1

## 2022-11-03 MED ORDER — ONDANSETRON HCL 4 MG/2ML IJ SOLN
INTRAMUSCULAR | Status: AC
  Filled 2022-11-03: qty 2

## 2022-11-03 MED ORDER — CHLORHEXIDINE GLUCONATE 0.12 % MT SOLN
15.0000 mL | Freq: Once | OROMUCOSAL | Status: AC
  Administered 2022-11-03: 15 mL via OROMUCOSAL
  Filled 2022-11-03: qty 15

## 2022-11-03 MED ORDER — MEPERIDINE HCL 25 MG/ML IJ SOLN: 6.2500 mg | INTRAMUSCULAR | Status: DC | PRN

## 2022-11-03 MED ORDER — LIDOCAINE HCL 1 % IJ SOLN
INTRAMUSCULAR | Status: DC | PRN
  Administered 2022-11-03: 10 mL

## 2022-11-03 MED ORDER — FENTANYL CITRATE (PF) 250 MCG/5ML IJ SOLN
INTRAMUSCULAR | Status: DC | PRN
  Administered 2022-11-03: 25 ug via INTRAVENOUS

## 2022-11-03 MED ORDER — LIDOCAINE HCL 1 % IJ SOLN
INTRAMUSCULAR | Status: AC
  Filled 2022-11-03: qty 20

## 2022-11-03 MED ORDER — FENTANYL CITRATE (PF) 250 MCG/5ML IJ SOLN
INTRAMUSCULAR | Status: AC
  Filled 2022-11-03: qty 5

## 2022-11-03 MED ORDER — FENTANYL CITRATE (PF) 100 MCG/2ML IJ SOLN: 25.0000 ug | INTRAMUSCULAR | Status: DC | PRN

## 2022-11-03 SURGICAL SUPPLY — 15 items
CANISTER SUCT 3000ML PPV (MISCELLANEOUS) ×1 IMPLANT
CATH ROBINSON RED A/P 16FR (CATHETERS) ×1 IMPLANT
DEVICE MYOSURE LITE (MISCELLANEOUS) IMPLANT
DEVICE MYOSURE REACH (MISCELLANEOUS) IMPLANT
GLOVE BIO SURGEON STRL SZ 6 (GLOVE) ×1 IMPLANT
GLOVE BIOGEL PI IND STRL 6.5 (GLOVE) ×1 IMPLANT
GLOVE SURG UNDER POLY LF SZ7 (GLOVE) ×1 IMPLANT
GOWN STRL REUS W/ TWL LRG LVL3 (GOWN DISPOSABLE) ×2 IMPLANT
GOWN STRL REUS W/TWL LRG LVL3 (GOWN DISPOSABLE) ×2
KIT PROCEDURE FLUENT (KITS) ×1 IMPLANT
KIT TURNOVER KIT B (KITS) ×1 IMPLANT
PACK VAGINAL MINOR WOMEN LF (CUSTOM PROCEDURE TRAY) ×1 IMPLANT
PAD OB MATERNITY 4.3X12.25 (PERSONAL CARE ITEMS) ×1 IMPLANT
SEAL ROD LENS SCOPE MYOSURE (ABLATOR) ×1 IMPLANT
TOWEL GREEN STERILE FF (TOWEL DISPOSABLE) ×2 IMPLANT

## 2022-11-03 NOTE — Anesthesia Procedure Notes (Addendum)
Procedure Name: LMA Insertion Date/Time: 11/03/2022 1:11 PM  Performed by: Shary Decamp, CRNAPre-anesthesia Checklist: Patient identified, Emergency Drugs available, Suction available and Patient being monitored Patient Re-evaluated:Patient Re-evaluated prior to induction Oxygen Delivery Method: Circle system utilized Preoxygenation: Pre-oxygenation with 100% oxygen Induction Type: IV induction Ventilation: Mask ventilation without difficulty LMA: LMA flexible inserted LMA Size: 4.0 Number of attempts: 1 Placement Confirmation: positive ETCO2 and breath sounds checked- equal and bilateral Tube secured with: Tape Dental Injury: Teeth and Oropharynx as per pre-operative assessment

## 2022-11-03 NOTE — Op Note (Signed)
11/03/22   Surgeon: Ellison Hughs, MD Preoperative Diagnoses: AUB, desire contraception Postoperative Diagnoses: same as above   Procedures performed:  1) Hysteroscopy dilation and curettage 2) Myosure resection of polypoid projections 3) Mirena IUD insertion   IVF: 700cc LR EBL: 75cc UOP: voided prior to procedure Fluid deficit: 180cc   Anesthesia: LMA  Findings: External genitalia WNL. 8cm anteverted mobile uterus with normal contour, BL adnexa WNL. Upon insertion of Myosure scope, multiple polypoid pink-tan projections noted. BL ostia seen after clearance   Indications: Ms Jamie Jordan is a 36yo [id number] with AUB-IMB. Office preoperative imaging via TVUS shows  right and left ovaries WNL, endometrial thickness 17mm, irregular borders, heterogenous polypiod mass/lesions x 2 with multiple hypervascular stalks. . Pt desires surgical management of AUB. Consented for above procedures   RBA:  The patient was informed of the risks and benefits of a hysteroscopy with dilation and curettage. Risks included but were not limited to bleeidng, infections, injury to the vulva, vagina or cerivx, or uterine perforation. If concern for latter, may proceed with diagnostic laparoscopy for evaluation of injury which may require surgical repair. Patient understands and is amenable.      Operative details: Patient was taken to operating room where general anesthesia via LMA was established. Placed in dorsal lithotomy with appropriate padded points. No antibiotics given preop via ACOG recommendations. Time out was performed after vaginal prep was carried out with Betadine. Tenaculum then placed on posterior cervical lip. Pratt dilators used (21) to dilate internal os. Gentle traction used to dilate internal os. Upon insertion of Myosure hysteroscope, findings noted as above. Myosure resection carried out until visible pahtology was removed. Scope removed, D&C carried out in gentle fashion clockwise until gritty  texture noted throughout. Scope withdrawn, Mirena IUD insertion in standard fashion (uterus sounded to 8cm), strings trimmed to 3cm and tucked in posterior fornix. Tenaculum removed. Bleeding controlled with pressure. All instruments removed from vagina.   Fluid deficit controlled throughout, final deficit as above  Patient tolerated procedure well. All counts correct at end of procedure

## 2022-11-03 NOTE — Interval H&P Note (Signed)
History and Physical Interval Note:  11/03/2022 12:38 PM  Jamie Jordan  has presented today for surgery, with the diagnosis of abnormal uterine bleeding.  The various methods of treatment have been discussed with the patient and family. After consideration of risks, benefits and other options for treatment, the patient has consented to  Procedure(s): DILATATION & CURETTAGE/HYSTEROSCOPY WITH MYOSURE (N/A) INTRAUTERINE DEVICE (IUD) INSERTION as a surgical intervention.  The patient's history has been reviewed, patient examined, no change in status, stable for surgery.  I have reviewed the patient's chart and labs.  Questions were answered to the patient's satisfaction.   Reviewed RBA of Mirena insertion, pt aware of 4wk postop visit  Jamie Jordan

## 2022-11-03 NOTE — Transfer of Care (Signed)
Immediate Anesthesia Transfer of Care Note  Patient: Jamie Jordan  Procedure(s) Performed: DILATATION & CURETTAGE (Vagina ) INTRAUTERINE DEVICE (IUD) INSERTION (Vagina ) HYSTEROSCOPY WITH MYOSURE (Vagina )  Patient Location: PACU  Anesthesia Type:General  Level of Consciousness: drowsy and patient cooperative  Airway & Oxygen Therapy: Patient Spontanous Breathing and Patient connected to face mask oxygen  Post-op Assessment: Report given to RN and Post -op Vital signs reviewed and stable  Post vital signs: Reviewed and stable  Last Vitals:  Vitals Value Taken Time  BP 97/53 11/03/22 1407  Temp    Pulse 65 11/03/22 1408  Resp 12 11/03/22 1408  SpO2 100 % 11/03/22 1408  Vitals shown include unvalidated device data.  Last Pain:  Vitals:   11/03/22 1120  TempSrc:   PainSc: 0-No pain         Complications: No notable events documented.

## 2022-11-06 ENCOUNTER — Encounter (HOSPITAL_COMMUNITY): Payer: Self-pay | Admitting: Obstetrics and Gynecology

## 2022-11-06 LAB — SURGICAL PATHOLOGY

## 2022-11-07 NOTE — Anesthesia Postprocedure Evaluation (Signed)
Anesthesia Post Note  Patient: Jamie Jordan  Procedure(s) Performed: DILATATION & CURETTAGE (Vagina ) INTRAUTERINE DEVICE (IUD) INSERTION (Vagina ) HYSTEROSCOPY WITH MYOSURE (Vagina )     Patient location during evaluation: PACU Anesthesia Type: General Level of consciousness: sedated and patient cooperative Pain management: pain level controlled Vital Signs Assessment: post-procedure vital signs reviewed and stable Respiratory status: spontaneous breathing Cardiovascular status: stable Anesthetic complications: no   No notable events documented.  Last Vitals:  Vitals:   11/03/22 1445 11/03/22 1500  BP: 114/76 107/81  Pulse: 71 78  Resp: 14 13  Temp:  36.9 C  SpO2: 100% 100%    Last Pain:  Vitals:   11/03/22 1500  TempSrc:   PainSc: 0-No pain                 Lewie Loron
